# Patient Record
Sex: Female | Born: 1965 | Race: White | Hispanic: No | Marital: Married | Smoking: Never smoker
Health system: Southern US, Community
[De-identification: ages and names within clinical notes are randomized; demographics above are authoritative.]

## PROBLEM LIST (undated history)

## (undated) DIAGNOSIS — E785 Hyperlipidemia, unspecified: Secondary | ICD-10-CM

## (undated) DIAGNOSIS — M2550 Pain in unspecified joint: Secondary | ICD-10-CM

## (undated) DIAGNOSIS — C439 Malignant melanoma of skin, unspecified: Secondary | ICD-10-CM

## (undated) DIAGNOSIS — I10 Essential (primary) hypertension: Secondary | ICD-10-CM

## (undated) HISTORY — DX: Pain in unspecified joint: M25.50

## (undated) HISTORY — DX: Essential (primary) hypertension: I10

## (undated) HISTORY — PX: SKIN LESION EXCISION: SHX2412

## (undated) HISTORY — DX: Hyperlipidemia, unspecified: E78.5

## (undated) HISTORY — PX: OTHER SURGICAL HISTORY: SHX169

## (undated) HISTORY — DX: Malignant melanoma of skin, unspecified: C43.9

---

## 1995-07-15 DIAGNOSIS — C439 Malignant melanoma of skin, unspecified: Secondary | ICD-10-CM

## 1995-07-15 HISTORY — DX: Malignant melanoma of skin, unspecified: C43.9

## 1995-07-15 HISTORY — PX: SKIN CANCER EXCISION: SHX779

## 2005-09-12 ENCOUNTER — Ambulatory Visit: Payer: Self-pay

## 2005-12-01 ENCOUNTER — Ambulatory Visit: Payer: Self-pay | Admitting: Specialist

## 2006-03-18 ENCOUNTER — Ambulatory Visit: Payer: Self-pay | Admitting: General Surgery

## 2006-05-19 ENCOUNTER — Encounter: Payer: Self-pay | Admitting: Podiatry

## 2006-06-13 ENCOUNTER — Encounter: Payer: Self-pay | Admitting: Podiatry

## 2006-09-15 ENCOUNTER — Ambulatory Visit: Payer: Self-pay | Admitting: General Surgery

## 2007-09-16 ENCOUNTER — Ambulatory Visit: Payer: Self-pay | Admitting: Internal Medicine

## 2007-10-05 ENCOUNTER — Ambulatory Visit: Payer: Self-pay

## 2007-10-08 ENCOUNTER — Ambulatory Visit: Payer: Self-pay | Admitting: Internal Medicine

## 2008-10-05 ENCOUNTER — Ambulatory Visit: Payer: Self-pay

## 2009-04-24 ENCOUNTER — Ambulatory Visit: Payer: Self-pay | Admitting: Unknown Physician Specialty

## 2009-05-01 ENCOUNTER — Ambulatory Visit: Payer: Self-pay | Admitting: Unknown Physician Specialty

## 2009-10-11 ENCOUNTER — Ambulatory Visit: Payer: Self-pay | Admitting: Unknown Physician Specialty

## 2010-10-14 ENCOUNTER — Ambulatory Visit: Payer: Self-pay | Admitting: Unknown Physician Specialty

## 2011-07-15 HISTORY — PX: DILATION AND CURETTAGE OF UTERUS: SHX78

## 2011-10-15 ENCOUNTER — Ambulatory Visit: Payer: Self-pay | Admitting: Unknown Physician Specialty

## 2012-11-03 ENCOUNTER — Ambulatory Visit: Payer: Self-pay | Admitting: Obstetrics and Gynecology

## 2013-02-02 ENCOUNTER — Emergency Department: Payer: Self-pay | Admitting: Emergency Medicine

## 2013-02-07 ENCOUNTER — Ambulatory Visit: Payer: Self-pay | Admitting: Orthopedic Surgery

## 2013-02-07 LAB — HCG, QUANTITATIVE, PREGNANCY: Beta Hcg, Quant.: <1 m[IU]/mL — ABNORMAL LOW

## 2013-12-28 ENCOUNTER — Ambulatory Visit: Payer: Self-pay | Admitting: Obstetrics and Gynecology

## 2014-01-07 DIAGNOSIS — I10 Essential (primary) hypertension: Secondary | ICD-10-CM | POA: Insufficient documentation

## 2014-01-07 DIAGNOSIS — G43909 Migraine, unspecified, not intractable, without status migrainosus: Secondary | ICD-10-CM | POA: Insufficient documentation

## 2014-01-07 DIAGNOSIS — E785 Hyperlipidemia, unspecified: Secondary | ICD-10-CM | POA: Insufficient documentation

## 2014-05-03 ENCOUNTER — Ambulatory Visit (INDEPENDENT_AMBULATORY_CARE_PROVIDER_SITE_OTHER): Payer: BC Managed Care – PPO

## 2014-05-03 ENCOUNTER — Encounter: Payer: Self-pay | Admitting: Podiatry

## 2014-05-03 ENCOUNTER — Ambulatory Visit (INDEPENDENT_AMBULATORY_CARE_PROVIDER_SITE_OTHER): Payer: BC Managed Care – PPO | Admitting: Podiatry

## 2014-05-03 VITALS — BP 147/84 | HR 67 | Resp 16 | Ht 67.0 in | Wt 261.0 lb

## 2014-05-03 DIAGNOSIS — M722 Plantar fascial fibromatosis: Secondary | ICD-10-CM

## 2014-05-03 MED ORDER — METHYLPREDNISOLONE (PAK) 4 MG PO TABS: ORAL_TABLET | ORAL | Status: DC

## 2014-05-03 NOTE — Progress Notes (Signed)
   Subjective:    Patient ID: Haley Brennan, female    DOB: 1965/10/27, 48 y.o.   MRN: [id number]  HPI Comments: i have pain on the outside of my left heel. Its been hurting for a while maybe since august or septemeber. Its getting worse. It hurts to walk and stand. i have gel inserts in my shoes, used ice and elevation.  Foot Pain Associated symptoms include numbness.      Review of Systems  Constitutional: Positive for unexpected weight change.  HENT:       Ringing in ears  Musculoskeletal:       Difficulty walking  Neurological: Positive for numbness.  Hematological: Bruises/bleeds easily.  All other systems reviewed and are negative.      Objective:   Physical Exam: I have reviewed her past medical history medications allergies surgeries social history and review of systems. Pulses are strongly palpable bilateral. Neurologic sensorium is intact per since once the monofilament. Deep tendon reflexes are intact bilateral muscle strength is 5 over 5 dorsiflexors plantar flexors inverters everters all intrinsic musculature is intact. Orthopedic evaluation demonstrates all joints distal to the ankle a full range of motion without crepitus she has pain on palpation of the medial and lateral calcaneal tubercles of the left foot no pain on medial and lateral compression however. Radiographic evaluation demonstrates plantar distally oriented calcaneal heel spur as well as a posterior proximally oriented calcaneal heel spur. Soft tissue increase in density at the plantar fascial calcaneal insertion site is indicative of plantar fasciitis.        Assessment & Plan:  Assessment: Plantar fasciitis of the left foot. Lateral compensatory syndrome left foot.  Plan: Injected the left heel today from the lateral aspect of Kenalog and local anesthetic placed her in a plantar fascial brace and she will start utilizing a night splint that she has at home. We discussed appropriate stretching  exercises ice therapy shoe gear modifications. I will followup with her in one month. I also wrote a prescription for Sterapred Dosepak to be followed by meloxicam.

## 2014-06-05 ENCOUNTER — Encounter: Payer: Self-pay | Admitting: Podiatry

## 2014-06-05 ENCOUNTER — Ambulatory Visit (INDEPENDENT_AMBULATORY_CARE_PROVIDER_SITE_OTHER): Payer: BC Managed Care – PPO | Admitting: Podiatry

## 2014-06-05 VITALS — BP 156/88 | HR 68 | Resp 16

## 2014-06-05 DIAGNOSIS — M722 Plantar fascial fibromatosis: Secondary | ICD-10-CM

## 2014-06-05 NOTE — Progress Notes (Signed)
She presents today for follow-up of her plantar fasciitis left foot. She states that she is 100% improved. She states that it really made a difference when she started wearing her plantar fascial night splint.  Objective: Vital signs are stable she is alert and oriented 3. She has no pain on palpation medial calcaneal tubercle left heel.  Assessment: Resolving plantar fasciitis left.  Plan: Continue all conservative therapies for 1 more month then discontinued.

## 2014-07-12 ENCOUNTER — Encounter: Payer: Self-pay | Admitting: *Deleted

## 2014-08-01 ENCOUNTER — Ambulatory Visit (INDEPENDENT_AMBULATORY_CARE_PROVIDER_SITE_OTHER): Payer: BC Managed Care – PPO | Admitting: General Surgery

## 2014-08-01 ENCOUNTER — Encounter: Payer: Self-pay | Admitting: General Surgery

## 2014-08-01 ENCOUNTER — Other Ambulatory Visit: Payer: BC Managed Care – PPO

## 2014-08-01 VITALS — BP 144/90 | HR 76 | Resp 16 | Ht 67.0 in | Wt 265.0 lb

## 2014-08-01 DIAGNOSIS — R229 Localized swelling, mass and lump, unspecified: Secondary | ICD-10-CM

## 2014-08-01 DIAGNOSIS — Z85820 Personal history of malignant melanoma of skin: Secondary | ICD-10-CM

## 2014-08-01 DIAGNOSIS — M7989 Other specified soft tissue disorders: Secondary | ICD-10-CM

## 2014-08-01 DIAGNOSIS — M799 Soft tissue disorder, unspecified: Secondary | ICD-10-CM

## 2014-08-01 NOTE — Progress Notes (Signed)
Patient ID: Haley Brennan, female   DOB: 04-03-1966, 49 y.o.   MRN: [id number]  Chief Complaint  Patient presents with  . Mass    left elbow    HPI Haley Brennan is a 49 y.o. female.  Here today for evaluation of a mass on her left elbow. She states it has been there for about 2 years. She seems to think it has gotten larger over the past year. It did seem to be more tender with exam by Dr. Kellie Moor.   HPI  Past Medical History  Diagnosis Date  . Melanoma 1997  . Hyperlipemia   . Joint pain   . Hypertension     Past Surgical History  Procedure Laterality Date  . Skin cancer excision Left 1997    melonoma /upper thigh x2  . Dilation and curettage of uterus  2013    with ablation  . Bone spur Right     heel  . Skin lesion excision Left ?    elbow/pre cancerous per pt    Family History  Problem Relation Age of Onset  . Cancer Father   . Seizures Father     Social History History  Substance Use Topics  . Smoking status: Never Smoker   . Smokeless tobacco: Never Used  . Alcohol Use: 0.0 oz/week    0 Not specified per week     Comment: occasionally    No Known Allergies  Current Outpatient Prescriptions  Medication Sig Dispense Refill  . aspirin 81 MG chewable tablet Chew by mouth.    . Cholecalciferol (VITAMIN D3) 2000 UNITS capsule Take by mouth.    . citalopram (CELEXA) 20 MG tablet Take by mouth.    . hydrochlorothiazide (HYDRODIURIL) 25 MG tablet Take 12.5 mg by mouth.     . meloxicam (MOBIC) 7.5 MG tablet Take by mouth.    . metoprolol (TOPROL-XL) 200 MG 24 hr tablet Take 200 mg by mouth daily.     . Pyridoxine HCl (VITAMIN B-6 PO) Take by mouth daily.     No current facility-administered medications for this visit.    Review of Systems Review of Systems  Constitutional: Negative.   Respiratory: Negative.   Cardiovascular: Negative.     Blood pressure 144/90, pulse 76, resp. rate 16, height 5\' 7"  (1.702 m), weight 265 lb (120.203 kg),  last menstrual period 05/02/2014.  Physical Exam Physical Exam  Constitutional: She is oriented to person, place, and time. She appears well-developed and well-nourished.  Cardiovascular: Normal rate and normal heart sounds.   Pulmonary/Chest: Effort normal and breath sounds normal.  Lymphadenopathy:    She has no cervical adenopathy.    She has no axillary adenopathy.  Neurological: She is alert and oriented to person, place, and time.  Skin: Skin is warm and dry.  1 cm firm hard subcutaneous mass left posterior lateral elbow crease.    Data Reviewed   US of the mass performed. There is a 85mm irregular hypoechoic mass seen likely subfascial location. No shadowing or thru transmission noted.  Assessment    Left elbow soft tissue mass. Does not feel this is a lipoma. This is amenable to excision here in office.     Plan    Recommended excision of the mass. Pt is agreeable.        Merrie Epler G 08/01/2014, 4:51 PM

## 2014-08-01 NOTE — Patient Instructions (Signed)
The patient is aware to call back for any questions or concerns.  

## 2014-08-07 DIAGNOSIS — F325 Major depressive disorder, single episode, in full remission: Secondary | ICD-10-CM | POA: Insufficient documentation

## 2014-08-15 ENCOUNTER — Ambulatory Visit (INDEPENDENT_AMBULATORY_CARE_PROVIDER_SITE_OTHER): Payer: BC Managed Care – PPO | Admitting: General Surgery

## 2014-08-15 ENCOUNTER — Encounter: Payer: Self-pay | Admitting: General Surgery

## 2014-08-15 VITALS — BP 130/84 | HR 78 | Resp 14 | Ht 67.0 in | Wt 263.0 lb

## 2014-08-15 DIAGNOSIS — M799 Soft tissue disorder, unspecified: Secondary | ICD-10-CM

## 2014-08-15 DIAGNOSIS — M7989 Other specified soft tissue disorders: Secondary | ICD-10-CM

## 2014-08-15 DIAGNOSIS — R229 Localized swelling, mass and lump, unspecified: Secondary | ICD-10-CM

## 2014-08-15 NOTE — Patient Instructions (Signed)
The patient is aware to call back for any questions or concerns.  

## 2014-08-15 NOTE — Progress Notes (Signed)
Patient ID: MERIA CRILLY, female   DOB: 1965/11/11, 49 y.o.   MRN: [id number]   The patient presents for a procedure. The procedure to be performed in an excision of a mass on the left elbow. No new complaints at this time.   The mass in question in the lateral aspect the left elbow was identified and marked on the skin with a linear incision planned. The area was infiltrated with about 8 mL 1% Xylocaine mixed with 0.5% Marcaine. After prepping with ChloraPrep and draping skin incision was made and deepened carefully through the subcutaneous tissue, few bleeding points were cauterized with thermal cautery. The deep fascia was opened and a firm 6-7 mm nodule was identified somewhat grayish in color. This was adherent to a little bit of the fatty tissue in this region and was  carefully freed and removed.   Specimen was sent to  histology in formalin. Deeper tissue closed with 3-0 Vicryl and the skin with subcuticular 4-0 Vicryl. Steri-Strips with tincture benzoin were applied and dressed with Telfa and Tegaderm. Procedure was well-tolerated with no immediate problems encountered. Patient will be advised to pathology once available follow-up will be announced based on the pathology.

## 2015-02-05 DIAGNOSIS — Z6841 Body Mass Index (BMI) 40.0 and over, adult: Secondary | ICD-10-CM | POA: Insufficient documentation

## 2016-02-06 ENCOUNTER — Other Ambulatory Visit: Payer: Self-pay | Admitting: Internal Medicine

## 2016-02-06 DIAGNOSIS — Z1231 Encounter for screening mammogram for malignant neoplasm of breast: Secondary | ICD-10-CM

## 2016-02-06 DIAGNOSIS — Z0000 Encounter for general adult medical examination without abnormal findings: Secondary | ICD-10-CM | POA: Insufficient documentation

## 2016-02-12 ENCOUNTER — Ambulatory Visit
Admission: RE | Admit: 2016-02-12 | Discharge: 2016-02-12 | Disposition: A | Discharge status: Home / Self Care | Payer: BC Managed Care – PPO | Source: Ambulatory Visit | Attending: Internal Medicine | Admitting: Internal Medicine

## 2016-02-12 ENCOUNTER — Other Ambulatory Visit: Payer: Self-pay | Admitting: Internal Medicine

## 2016-02-12 DIAGNOSIS — Z1231 Encounter for screening mammogram for malignant neoplasm of breast: Secondary | ICD-10-CM | POA: Insufficient documentation

## 2016-07-22 ENCOUNTER — Other Ambulatory Visit: Payer: Self-pay | Admitting: Internal Medicine

## 2016-07-22 DIAGNOSIS — N63 Unspecified lump in unspecified breast: Secondary | ICD-10-CM

## 2016-08-01 ENCOUNTER — Encounter: Payer: Self-pay | Admitting: Radiology

## 2016-08-01 ENCOUNTER — Ambulatory Visit
Admission: RE | Admit: 2016-08-01 | Discharge: 2016-08-01 | Disposition: A | Discharge status: Home / Self Care | Payer: BC Managed Care – PPO | Source: Ambulatory Visit | Attending: Internal Medicine | Admitting: Internal Medicine

## 2016-08-01 DIAGNOSIS — N63 Unspecified lump in unspecified breast: Secondary | ICD-10-CM

## 2018-12-08 ENCOUNTER — Other Ambulatory Visit: Payer: Self-pay | Admitting: Podiatry

## 2018-12-08 ENCOUNTER — Ambulatory Visit (INDEPENDENT_AMBULATORY_CARE_PROVIDER_SITE_OTHER): Payer: BC Managed Care – PPO

## 2018-12-08 ENCOUNTER — Encounter: Payer: Self-pay | Admitting: Podiatry

## 2018-12-08 ENCOUNTER — Ambulatory Visit: Payer: BC Managed Care – PPO | Admitting: Podiatry

## 2018-12-08 ENCOUNTER — Other Ambulatory Visit: Payer: Self-pay

## 2018-12-08 VITALS — Temp 97.5°F

## 2018-12-08 DIAGNOSIS — J45909 Unspecified asthma, uncomplicated: Secondary | ICD-10-CM | POA: Insufficient documentation

## 2018-12-08 DIAGNOSIS — M7662 Achilles tendinitis, left leg: Secondary | ICD-10-CM | POA: Diagnosis not present

## 2018-12-08 DIAGNOSIS — Z85820 Personal history of malignant melanoma of skin: Secondary | ICD-10-CM | POA: Insufficient documentation

## 2018-12-08 DIAGNOSIS — M722 Plantar fascial fibromatosis: Secondary | ICD-10-CM

## 2018-12-08 MED ORDER — METHYLPREDNISOLONE 4 MG PO TBPK: ORAL_TABLET | ORAL | 0 refills | Status: DC

## 2018-12-08 NOTE — Patient Instructions (Signed)

## 2018-12-08 NOTE — Progress Notes (Signed)
Subjective:  Patient ID: Haley Brennan, female    DOB: 1966/07/12,  MRN: [id number] HPI Chief Complaint  Patient presents with  . Foot Pain    Posterior heel left - aching x 1 week, AM pain, worsened last couple days, some swelling, icing and not going barefoot  . Foot Pain    Check knot dorsal midfoot left     . New Patient (Initial Visit)    Est pt 4    53 y.o. female presents with the above complaint.   ROS: Denies fever chills nausea vomiting muscle aches pains calf pain back pain chest pain shortness of breath.  Past Medical History:  Diagnosis Date  . Hyperlipemia   . Hypertension   . Joint pain   . Melanoma ([place]) 1997   Past Surgical History:  Procedure Laterality Date  . bone spur Right    heel  . DILATION AND CURETTAGE OF UTERUS  2013   with ablation  . SKIN CANCER EXCISION Left 1997   melonoma /upper thigh x2  . SKIN LESION EXCISION Left ?   elbow/pre cancerous per pt    Current Outpatient Medications:  .  metoprolol (TOPROL-XL) 200 MG 24 hr tablet, Take 200 mg by mouth daily. , Disp: , Rfl:  .  aspirin 81 MG chewable tablet, Chew by mouth., Disp: , Rfl:  .  Cholecalciferol (VITAMIN D3) 2000 UNITS capsule, Take by mouth., Disp: , Rfl:  .  citalopram (CELEXA) 20 MG tablet, Take by mouth., Disp: , Rfl:  .  methylPREDNISolone (MEDROL DOSEPAK) 4 MG TBPK tablet, 6 day dose pack - take as directed, Disp: 21 tablet, Rfl: 0 .  olmesartan-hydrochlorothiazide (BENICAR HCT) 40-25 MG tablet, , Disp: , Rfl:  .  Pyridoxine HCl (VITAMIN B-6 PO), Take by mouth daily., Disp: , Rfl:  .  traZODone (DESYREL) 50 MG tablet, , Disp: , Rfl:   No Known Allergies Review of Systems Objective:   Vitals:   12/08/18 0908  Temp: (!) 97.5 F (36.4 C)    General: Well developed, nourished, in no acute distress, alert and oriented x3   Dermatological: Skin is warm, dry and supple bilateral. Nails x 10 are well maintained; remaining integument appears unremarkable at this  time. There are no open sores, no preulcerative lesions, no rash or signs of infection present.  Vascular: Dorsalis Pedis artery and Posterior Tibial artery pedal pulses are 2/4 bilateral with immedate capillary fill time. Pedal hair growth present. No varicosities and no lower extremity edema present bilateral.   Neruologic: Grossly intact via light touch bilateral. Vibratory intact via tuning fork bilateral. Protective threshold with Semmes Wienstein monofilament intact to all pedal sites bilateral. Patellar and Achilles deep tendon reflexes 2+ bilateral. No Babinski or clonus noted bilateral.   Musculoskeletal: No gross boney pedal deformities bilateral. No pain, crepitus, or limitation noted with foot and ankle range of motion bilateral. Muscular strength 5/5 in all groups tested bilateral.  Gait: Unassisted, Nonantalgic.    Radiographs:  Radiographs taken today demonstrate retrocalcaneal heel spur as well as plantar calcaneal heel spur with calcification of the plantar fascia.  She also has thickening of soft tissue in the posterior aspect of the calcaneus possibly bursitis also thickening of the distal Achilles indicating a tendinopathy.  Assessment & Plan:   Assessment: Insertional Achilles tendinitis with tendinopathy.  Plan: Start her on a Medrol Dosepak.  Injected the posterior aspect with 2 mg of dexamethasone and local anesthetic extra Betadine skin prep into the bursa.  She  tolerated procedure well without complications.  Placed her in a short cam walker.  We will follow-up with her in 1 month.     Jessilynn Taft T. [place], [place]

## 2019-01-12 ENCOUNTER — Ambulatory Visit: Payer: BC Managed Care – PPO | Admitting: Podiatry

## 2019-01-12 ENCOUNTER — Encounter: Payer: Self-pay | Admitting: Podiatry

## 2019-01-12 ENCOUNTER — Other Ambulatory Visit: Payer: Self-pay

## 2019-01-12 VITALS — Temp 98.6°F

## 2019-01-12 DIAGNOSIS — M7662 Achilles tendinitis, left leg: Secondary | ICD-10-CM | POA: Diagnosis not present

## 2019-01-12 DIAGNOSIS — S86012A Strain of left Achilles tendon, initial encounter: Secondary | ICD-10-CM

## 2019-01-12 DIAGNOSIS — S86312A Strain of muscle(s) and tendon(s) of peroneal muscle group at lower leg level, left leg, initial encounter: Secondary | ICD-10-CM

## 2019-01-12 NOTE — Progress Notes (Signed)
She presents today left foot and ankle has some swelling to it and some days are good and some days are bad she states that the medicine appeared to do great work for her.  Objective: Vital signs are stable she is alert and oriented x3 she has severe pain on palpation of Achilles tendon left.  Swelling around the ankle.  Assessment: At this point I feel that there is probably a tear of the tendo Achilles in the posterior inferior aspect.  Soft tissue edema along the peroneals question as to whether or not there may be a tear in the peroneal tendons as well.  Plan: At this point requesting an MRI so as to determine whether or not we can perform surgery or physical therapy.

## 2019-01-12 NOTE — Addendum Note (Signed)
Addended by: Rip Harbour on: 01/12/2019 05:46 PM   Modules accepted: Orders

## 2019-01-16 ENCOUNTER — Encounter: Payer: Self-pay | Admitting: Podiatry

## 2019-01-20 NOTE — Telephone Encounter (Signed)
I called and spoke with patient, she states that the swelling in her leg has gone down a lot and she denied any pain or cramping in her calf.  She states that the compression anklet does help with pain and swelling in her ankle/foot.  I educated her on s/s of DVT and to go straight to ER is she has those symptoms.  I instructed her to continue to keep foot/leg elevated as needed for pain and swelling, use ice and night splint to help with stretching.  She will keep MRI appt and follow up with Dr. Milinda Pointer after MRI is done.

## 2019-01-25 ENCOUNTER — Telehealth: Payer: Self-pay | Admitting: *Deleted

## 2019-01-25 NOTE — Telephone Encounter (Signed)
MRI has been approved from 01/25/2019 to 07/23/2019 Auth # [id number]

## 2019-01-25 NOTE — Telephone Encounter (Signed)
Haley Brennan - [facility] pre-cert is needed for MRI.

## 2019-01-27 ENCOUNTER — Other Ambulatory Visit: Payer: Self-pay

## 2019-01-27 ENCOUNTER — Ambulatory Visit
Admission: RE | Admit: 2019-01-27 | Discharge: 2019-01-27 | Disposition: A | Discharge status: Home / Self Care | Payer: BC Managed Care – PPO | Source: Ambulatory Visit | Attending: Podiatry | Admitting: Podiatry

## 2019-01-27 DIAGNOSIS — S86012A Strain of left Achilles tendon, initial encounter: Secondary | ICD-10-CM | POA: Insufficient documentation

## 2019-01-27 DIAGNOSIS — S86312A Strain of muscle(s) and tendon(s) of peroneal muscle group at lower leg level, left leg, initial encounter: Secondary | ICD-10-CM | POA: Diagnosis present

## 2019-02-01 ENCOUNTER — Telehealth: Payer: Self-pay

## 2019-02-01 NOTE — Telephone Encounter (Signed)
-----   Message from Andres Ege, RN sent at 01/28/2019  9:44 AM EDT ----- Janace Hoard, please assist. Marcy Siren ----- Message ----- From: Cedric Fishman Sent: 01/27/2019   5:05 PM EDT To: Andres Ege, RN  Send for over read and inform patient of the delay.

## 2019-02-01 NOTE — Telephone Encounter (Signed)
Request for MRI disc has been faxed to [facility].  Patient has been notified via voice mail.

## 2019-02-09 ENCOUNTER — Telehealth: Payer: Self-pay

## 2019-02-09 NOTE — Telephone Encounter (Signed)
Copy of disc mailed to [facility].

## 2019-02-09 NOTE — Telephone Encounter (Signed)
-----   Message from Andres Ege, RN sent at 01/28/2019  9:44 AM EDT ----- Janace Hoard, please assist. Marcy Siren ----- Message ----- From: Cedric Fishman Sent: 01/27/2019   5:05 PM EDT To: Andres Ege, RN  Send for over read and inform patient of the delay.

## 2019-02-22 ENCOUNTER — Encounter: Payer: Self-pay | Admitting: Podiatry

## 2019-02-23 ENCOUNTER — Encounter: Payer: Self-pay | Admitting: Podiatry

## 2019-02-23 ENCOUNTER — Ambulatory Visit: Payer: BC Managed Care – PPO | Admitting: Podiatry

## 2019-02-23 ENCOUNTER — Other Ambulatory Visit: Payer: Self-pay

## 2019-02-23 ENCOUNTER — Telehealth: Payer: Self-pay | Admitting: *Deleted

## 2019-02-23 VITALS — Temp 97.9°F

## 2019-02-23 DIAGNOSIS — M7662 Achilles tendinitis, left leg: Secondary | ICD-10-CM | POA: Diagnosis not present

## 2019-02-23 DIAGNOSIS — S86012A Strain of left Achilles tendon, initial encounter: Secondary | ICD-10-CM

## 2019-02-23 NOTE — Telephone Encounter (Signed)
"  I'm calling to schedule an appointment."

## 2019-02-23 NOTE — Progress Notes (Signed)
She presents today for chronic pain to the Achilles tendon left.  States that it is okay when I am in the boot but 5 not in the boot it hurts.  She states that is can wear this boot forever.  She denies changes in her past medical history medications allergies surgeries and social history.  ROS: Denies fever chills nausea vomiting muscle aches pains calf pain back pain chest pain shortness of breath.  Objective: Vital signs are stable alert and oriented x3.  Pulses are palpable.  Left foot is swollen painful on palpation of the Achilles particularly overlying the insertion of the posterior calcaneus she has good range of motion plantar flexion is painful  MRI does relate distal tendinopathy with Achilles tendon tears at its insertion.  Assessment: Insertional Achilles tendinitis and tendinopathy with tearing.  Plan: Discussed etiology pathology conservative versus surgical therapies.  At this point we consented her for a gastroc recession and Achilles tenolysis and a retrocalcaneal heel spur resection.  She will also have a cast on.  We did discuss the possible postop complications which may include but not limited to postop pain bleeding swelling infection recurrence need for further surgery overcorrection under correction also digit loss of limb loss of life.  She was provided with both oral and written ingrown home-going instruction for the morning of surgery as well as information regarding the surgery center and anesthesia group.

## 2019-02-23 NOTE — Patient Instructions (Signed)
Pre-Operative Instructions  Congratulations, you have decided to take an important step towards improving your quality of life.  You can be assured that the doctors and staff at [facility] will be with you every step of the way.  Here are some important things you should know:  1. Plan to be at the surgery center/hospital at least 1 (one) hour prior to your scheduled time, unless otherwise directed by the surgical center/hospital staff.  You must have a responsible adult accompany you, remain during the surgery and drive you home.  Make sure you have directions to the surgical center/hospital to ensure you arrive on time. 2. If you are having surgery at [facility] or [facility], you will need a copy of your medical history and physical form from your family physician within one month prior to the date of surgery. We will give you a form for your primary physician to complete.  3. We make every effort to accommodate the date you request for surgery.  However, there are times where surgery dates or times have to be moved.  We will contact you as soon as possible if a change in schedule is required.   4. No aspirin/ibuprofen for one week before surgery.  If you are on aspirin, any non-steroidal anti-inflammatory medications (Mobic, Aleve, Ibuprofen) should not be taken seven (7) days prior to your surgery.  You make take Tylenol for pain prior to surgery.  5. Medications - If you are taking daily heart and blood pressure medications, seizure, reflux, allergy, asthma, anxiety, pain or diabetes medications, make sure you notify the surgery center/hospital before the day of surgery so they can tell you which medications you should take or avoid the day of surgery. 6. No food or drink after midnight the night before surgery unless directed otherwise by surgical center/hospital staff. 7. No alcoholic beverages 24-hours prior to surgery.  No smoking 24-hours prior or 24-hours after  surgery. 8. Wear loose pants or shorts. They should be loose enough to fit over bandages, boots, and casts. 9. Don't wear slip-on shoes. Sneakers are preferred. 10. Bring your boot with you to the surgery center/hospital.  Also bring crutches or a walker if your physician has prescribed it for you.  If you do not have this equipment, it will be provided for you after surgery. 11. If you have not been contacted by the surgery center/hospital by the day before your surgery, call to confirm the date and time of your surgery. 12. Leave-time from work may vary depending on the type of surgery you have.  Appropriate arrangements should be made prior to surgery with your employer. 13. Prescriptions will be provided immediately following surgery by your doctor.  Fill these as soon as possible after surgery and take the medication as directed. Pain medications will not be refilled on weekends and must be approved by the doctor. 14. Remove nail polish on the operative foot and avoid getting pedicures prior to surgery. 15. Wash the night before surgery.  The night before surgery wash the foot and leg well with water and the antibacterial soap provided. Be sure to pay special attention to beneath the toenails and in between the toes.  Wash for at least three (3) minutes. Rinse thoroughly with water and dry well with a towel.  Perform this wash unless told not to do so by your physician.  Enclosed: 1 Ice pack (please put in freezer the night before surgery)   1 Hibiclens skin cleaner     Pre-op instructions  If you have any questions regarding the instructions, please do not hesitate to call our office.  [place]: [address], [place], [place] [address] -- [phone]  [place]: [address] [place], [place] [address] -- [phone]  [place]: [address]  , [place] [address] -- [phone]  [place]: [address], [address], [place], [place] [address] -- [phone]  Website:  [email or web address] 

## 2019-02-24 NOTE — Telephone Encounter (Signed)
"  I am calling to try to schedule an appointment for surgery.  I went and saw Dr. Milinda Pointer yesterday about surgery on my ankle."

## 2019-02-25 NOTE — Telephone Encounter (Signed)
"  I'm calling to schedule my surgery."  Dr. Milinda Pointer does surgeries on Fridays.  Do you have a date that you like?  "Whatever his next available date is but not too soon, maybe within a month or so."  Dr. Milinda Pointer can do it on any Friday in September.  "I can't do it on September 4, how about the following Friday?"  Dr. Milinda Pointer can do it on March 25, 2019.  "Okay, let's schedule it for then."  Someone from the surgical center will call you a day or two prior to your surgery date and will give you your arrival time.  You need to register with the surgical center via their online portal, instructions are located in the brochure that we gave you on the dark blue page.  It's called [facility].  "I will take care of it."

## 2019-03-11 ENCOUNTER — Telehealth: Payer: Self-pay | Admitting: *Deleted

## 2019-03-11 NOTE — Telephone Encounter (Signed)
DOS 03/25/2019 MANIPULATION OF ANKLE - 27860; CALCANEAL OSTECTOMY - [id number]; TARSAL EXOSTECTOMY - [id number]; ACHILLES TENOLYSIS - [id number]; AND GASTROCNEMIUS RECESSION - [id number]  BCBS: Effective Date - 07/14/2018 - 07/13/9998  In-Network    Max Per Benefit Period Year-to-Date Remaining  CoInsurance  30%   Deductible  $1500.00 $0.00  Out-Of-Pocket  $5900.00 $3376.40   AMBULATORY SURGERY  In Network  Copay Coinsurance  Not Applicable  [id number] per [place]

## 2019-03-16 ENCOUNTER — Encounter: Payer: Self-pay | Admitting: Podiatry

## 2019-03-22 ENCOUNTER — Telehealth: Payer: Self-pay | Admitting: Podiatry

## 2019-03-22 NOTE — Telephone Encounter (Signed)
Pt called, she has sent a mychart message on 03/17/2019. Please call pt, she has sx on Friday with Dr. Milinda Pointer and she has questions.

## 2019-03-24 ENCOUNTER — Other Ambulatory Visit: Payer: Self-pay | Admitting: Podiatry

## 2019-03-24 MED ORDER — OXYCODONE-ACETAMINOPHEN 10-325 MG PO TABS: 1.0000 | ORAL_TABLET | Freq: Three times a day (TID) | ORAL | 0 refills | Status: AC | PRN

## 2019-03-24 MED ORDER — CEPHALEXIN 500 MG PO CAPS: 500.0000 mg | ORAL_CAPSULE | Freq: Three times a day (TID) | ORAL | 0 refills | Status: DC

## 2019-03-24 MED ORDER — ONDANSETRON HCL 4 MG PO TABS: 4.0000 mg | ORAL_TABLET | Freq: Three times a day (TID) | ORAL | 0 refills | Status: DC | PRN

## 2019-03-24 NOTE — Telephone Encounter (Signed)
I called patient and left message to call back.

## 2019-03-24 NOTE — Telephone Encounter (Signed)
I called patient and all questions were answered regarding surgery and post op.  I informed her to call me back if she had any further questions.  She verbalized understanding

## 2019-03-25 ENCOUNTER — Encounter: Payer: Self-pay | Admitting: Podiatry

## 2019-03-25 DIAGNOSIS — M25772 Osteophyte, left ankle: Secondary | ICD-10-CM

## 2019-03-25 DIAGNOSIS — M25775 Osteophyte, left foot: Secondary | ICD-10-CM | POA: Diagnosis not present

## 2019-03-25 DIAGNOSIS — M7662 Achilles tendinitis, left leg: Secondary | ICD-10-CM | POA: Diagnosis not present

## 2019-03-25 DIAGNOSIS — M216X2 Other acquired deformities of left foot: Secondary | ICD-10-CM

## 2019-03-25 DIAGNOSIS — M7732 Calcaneal spur, left foot: Secondary | ICD-10-CM

## 2019-03-30 ENCOUNTER — Other Ambulatory Visit: Payer: Self-pay

## 2019-03-30 ENCOUNTER — Ambulatory Visit (INDEPENDENT_AMBULATORY_CARE_PROVIDER_SITE_OTHER): Payer: Self-pay | Admitting: Podiatry

## 2019-03-30 ENCOUNTER — Ambulatory Visit (INDEPENDENT_AMBULATORY_CARE_PROVIDER_SITE_OTHER): Payer: BC Managed Care – PPO

## 2019-03-30 ENCOUNTER — Encounter: Payer: Self-pay | Admitting: Podiatry

## 2019-03-30 VITALS — BP 107/67 | HR 63 | Temp 98.2°F | Resp 16

## 2019-03-30 DIAGNOSIS — S86012A Strain of left Achilles tendon, initial encounter: Secondary | ICD-10-CM

## 2019-03-30 DIAGNOSIS — M7662 Achilles tendinitis, left leg: Secondary | ICD-10-CM | POA: Diagnosis not present

## 2019-03-30 DIAGNOSIS — Z9889 Other specified postprocedural states: Secondary | ICD-10-CM

## 2019-03-30 NOTE — Progress Notes (Signed)
She presents today status post Achilles tenolysis gastroc recession calcaneal osteotomy and tarsal access ectomy states it was swollen last night and painful.  She denies fever chills nausea vomiting muscle aches pains calf pain back pain chest pain shortness of breath.  Objective: She presents today vital signs stable alert and oriented x3 cast intact clean on the bottom utilizing the scooter.  Toes are normal temperature and have good range of motion with good sensation.  Radiographs taken today demonstrate a retrocalcaneal heel spur resection complete.  Assessment: Well-healing surgical foot.  Plan: Follow-up with her in 1 week for cast change.

## 2019-04-06 ENCOUNTER — Other Ambulatory Visit: Payer: Self-pay

## 2019-04-06 ENCOUNTER — Encounter: Payer: Self-pay | Admitting: Podiatry

## 2019-04-06 ENCOUNTER — Ambulatory Visit (INDEPENDENT_AMBULATORY_CARE_PROVIDER_SITE_OTHER): Payer: BC Managed Care – PPO | Admitting: Podiatry

## 2019-04-06 DIAGNOSIS — Z9889 Other specified postprocedural states: Secondary | ICD-10-CM | POA: Diagnosis not present

## 2019-04-07 ENCOUNTER — Encounter: Payer: Self-pay | Admitting: Podiatry

## 2019-04-07 NOTE — Progress Notes (Signed)
She presents today for postop visit #2 date of surgery 03/25/2019 status post Achilles tenolysis gastroc recession calcaneal  ostectomy tarsal exostectomy and manipulation of the ankle with a cast.  She denies fever chills nausea vomiting muscle aches pains calf pain back pain chest pain shortness of breath.  Objective: Presents today nonweightbearing cast intact clean and dry.  Once removed demonstrates dressed her dressing intact once removed demonstrates staples are intact margins well coapted.  No erythema edema cellulitis drainage or odor.  Skin good plantar flexion against resistance with no pain.  Assessment: Well-healing surgical foot.  Plan: Redressed today dressed a compressive dressing.  She also had a new cast applied she will continue nonweightbearing status follow-up with her in 2 weeks for cast change to a cam walker.

## 2019-04-20 ENCOUNTER — Encounter: Payer: Self-pay | Admitting: Podiatry

## 2019-04-20 ENCOUNTER — Ambulatory Visit (INDEPENDENT_AMBULATORY_CARE_PROVIDER_SITE_OTHER): Payer: BC Managed Care – PPO | Admitting: Podiatry

## 2019-04-20 ENCOUNTER — Other Ambulatory Visit: Payer: Self-pay

## 2019-04-20 DIAGNOSIS — M7662 Achilles tendinitis, left leg: Secondary | ICD-10-CM

## 2019-04-20 DIAGNOSIS — S86012A Strain of left Achilles tendon, initial encounter: Secondary | ICD-10-CM

## 2019-04-20 DIAGNOSIS — Z9889 Other specified postprocedural states: Secondary | ICD-10-CM

## 2019-04-20 NOTE — Progress Notes (Signed)
She presents today date of surgery March 25, 2019 we will follow-up with she is status post Achilles tenolysis gastrocnemius recession calcaneal osteotomy tarsal exostectomy manipulation of ankle and cast.  She denies fever chills nausea vomiting muscle aches pains.  States that she has some allodynic type pain since the cast was removed today.  Objective: Vital signs are stable she is alert and oriented x3.  Pulses are palpable once the cast was removed.  Dressed her dressing was removed demonstrates staples are intact margins well coapted staples were removed today does not demonstrate any type of dehiscence.  She has good plantar flexion against resistance.  Assessment: Well-healing surgical leg and foot.  Plan: Request that she use Steri-Strips for the next few days placed a small light dressing and then in 3 days she can start washing this.  Placed her in a cam walker she will start with static standing over the next week regress to partial weightbearing with a knee scooter and then over the third week progressed to full weightbearing with the boot.  Follow-up with her in 2 to 3 weeks

## 2019-04-28 ENCOUNTER — Encounter: Payer: Self-pay | Admitting: Podiatry

## 2019-04-28 ENCOUNTER — Other Ambulatory Visit: Payer: Self-pay

## 2019-04-28 ENCOUNTER — Ambulatory Visit (INDEPENDENT_AMBULATORY_CARE_PROVIDER_SITE_OTHER): Payer: Self-pay | Admitting: Podiatry

## 2019-04-28 ENCOUNTER — Encounter: Payer: BC Managed Care – PPO | Admitting: Podiatry

## 2019-04-28 DIAGNOSIS — Z09 Encounter for follow-up examination after completed treatment for conditions other than malignant neoplasm: Secondary | ICD-10-CM | POA: Insufficient documentation

## 2019-04-28 NOTE — Progress Notes (Signed)
Patient says she  has a staple at the back of her surgical site from her surgery by Dr. Milinda Pointer.  I  walk into the treatment room and she shows me a picture of her surgical site with what appears to be a staple covering an open area of the incision site.  She says she has occasional pain at the site of the staple and skin separation.  No evidence of any pus or drainage or infection is described.  She presents the office today on a scooter wearing a bandage over the back of her left foot covering the surgical site.  Neurovascular status intact left foot.  Healing noted at the surgical sites on the left foot.  There is separation of skin at the distal aspect of the incision at the Achilles tendon left foot.  There is no evidence of any staple clinically.  Patient does have necrotic tissue which appears to be suture related at the open wound site left foot.    ROV post op visit.    Examination of the  open wound reveals no evidence of any staple noted.  The necrotic tissue at the site of the open wound is debrided after Betadine wash.  The site was explored with surgical hemostat and no evidence of any screw/staple was noted.  The open wound was bandaged with Neosporin dry sterile dressing.  Patient was told that she should soak this foot in soapy sudsy water and cover the site with Neosporin dry sterile dressing.  She should return to the office for her regularly scheduled visit with Dr. Milinda Pointer.   Gardiner Barefoot DPM

## 2019-05-04 ENCOUNTER — Other Ambulatory Visit: Payer: BC Managed Care – PPO

## 2019-05-09 ENCOUNTER — Encounter: Payer: Self-pay | Admitting: Podiatry

## 2019-05-09 ENCOUNTER — Other Ambulatory Visit: Payer: Self-pay

## 2019-05-09 ENCOUNTER — Other Ambulatory Visit: Payer: BC Managed Care – PPO

## 2019-05-09 ENCOUNTER — Ambulatory Visit (INDEPENDENT_AMBULATORY_CARE_PROVIDER_SITE_OTHER): Payer: BC Managed Care – PPO

## 2019-05-09 ENCOUNTER — Ambulatory Visit (INDEPENDENT_AMBULATORY_CARE_PROVIDER_SITE_OTHER): Payer: Self-pay | Admitting: Podiatry

## 2019-05-09 DIAGNOSIS — Z9889 Other specified postprocedural states: Secondary | ICD-10-CM

## 2019-05-09 DIAGNOSIS — S86012A Strain of left Achilles tendon, initial encounter: Secondary | ICD-10-CM

## 2019-05-09 NOTE — Progress Notes (Signed)
She presents today date of surgery 03/25/2019 status post Achilles tendon repair with a gastroc recession.  States that it seems to be doing a lot better and the wound that was present distally appears to be healing very nicely.  She is concerned that she saw Dr. Sharyon Cable last week for removal of staple.  She states that he could not find the staple but it seems to be getting better anyway.  Currently she is no longer wearing her cam boot.  Objective: Vital signs are stable she is alert and oriented x3.  There is no erythema minimal edema no cellulitis drainage or odor lesion to the posterior aspect where the incision was appears to be healing very nicely from deep to superficial and I see no signs of a staple.  Radiographs were performed today to make sure that there was no retention of a staple in the soft tissues.  Assessment: Well-healing surgical foot left.  Plan: Discussed etiology pathology conservative surgical therapies encouraged her to have continue range of motion and to continue to treat the wound until is completely healed.  Also continue to exercise caution when walking without the boot but I would encourage her to continue to walk without the boot to increase strength and stability.  I will follow-up with her in 1 month we will allow her to get back to work as long as she can sit and keep foot elevated.

## 2019-06-03 ENCOUNTER — Encounter: Payer: Self-pay | Admitting: Podiatry

## 2019-06-15 ENCOUNTER — Ambulatory Visit (INDEPENDENT_AMBULATORY_CARE_PROVIDER_SITE_OTHER): Payer: Self-pay | Admitting: Podiatry

## 2019-06-15 ENCOUNTER — Other Ambulatory Visit: Payer: Self-pay

## 2019-06-15 DIAGNOSIS — Z9889 Other specified postprocedural states: Secondary | ICD-10-CM

## 2019-06-15 NOTE — Progress Notes (Signed)
She presents today for left Achilles tendon states that the dorsal midfoot is still little bit numb but other than that the Achilles seems to be doing okay.  Still likes to wear her boot.  Objective: She has no reproducible pain on palpation of the left foot and heel or gastroc.  Assessment: Well-healing surgical foot.  Plan: On follow-up with her in a couple of months.

## 2019-07-01 ENCOUNTER — Encounter: Payer: Self-pay | Admitting: Podiatry

## 2019-07-01 DIAGNOSIS — I82402 Acute embolism and thrombosis of unspecified deep veins of left lower extremity: Secondary | ICD-10-CM

## 2019-07-01 DIAGNOSIS — M25472 Effusion, left ankle: Secondary | ICD-10-CM

## 2019-07-01 DIAGNOSIS — I872 Venous insufficiency (chronic) (peripheral): Secondary | ICD-10-CM

## 2019-07-01 DIAGNOSIS — Z9889 Other specified postprocedural states: Secondary | ICD-10-CM

## 2019-07-01 NOTE — Telephone Encounter (Signed)
Dr. Milinda Pointer, I spoke with patient, she said that she is having trouble with her surgical ankle swelling.  She stated that she keeps it elevated some and doesn't really hurt.  I recommended her getting some knee high compression stockings to try and elevate when the swelling got bad.  Do you have any further recommendations or do you want her to come back in sooner than her follow up appt?  She knows It will be Monday before I get back to her.  Please advise

## 2019-07-05 ENCOUNTER — Other Ambulatory Visit: Payer: Self-pay

## 2019-07-05 ENCOUNTER — Encounter: Payer: Self-pay | Admitting: Podiatry

## 2019-07-05 ENCOUNTER — Ambulatory Visit (INDEPENDENT_AMBULATORY_CARE_PROVIDER_SITE_OTHER): Payer: BC Managed Care – PPO

## 2019-07-05 DIAGNOSIS — Z9889 Other specified postprocedural states: Secondary | ICD-10-CM | POA: Diagnosis not present

## 2019-07-05 DIAGNOSIS — I82402 Acute embolism and thrombosis of unspecified deep veins of left lower extremity: Secondary | ICD-10-CM

## 2019-07-05 DIAGNOSIS — M25472 Effusion, left ankle: Secondary | ICD-10-CM | POA: Diagnosis not present

## 2019-07-05 NOTE — Addendum Note (Signed)
Addended by: Graceann Congress D on: 07/05/2019 09:42 AM   Modules accepted: Orders

## 2019-07-05 NOTE — Telephone Encounter (Signed)
Per Dr. Milinda Pointer, orders have been entered in epic and faxed to [facility] for vascular studies stat.  Patient has been notified of his instructions and will wait for call from [facility] to be scheduled.

## 2019-07-06 ENCOUNTER — Ambulatory Visit: Payer: BC Managed Care – PPO | Attending: Internal Medicine

## 2019-07-06 ENCOUNTER — Encounter: Payer: Self-pay | Admitting: *Deleted

## 2019-07-06 ENCOUNTER — Other Ambulatory Visit: Payer: Self-pay | Admitting: *Deleted

## 2019-07-06 DIAGNOSIS — Z20822 Contact with and (suspected) exposure to covid-19: Secondary | ICD-10-CM

## 2019-07-08 LAB — NOVEL CORONAVIRUS, NAA: SARS-CoV-2, NAA: NOT DETECTED

## 2019-07-13 ENCOUNTER — Other Ambulatory Visit (HOSPITAL_COMMUNITY)
Admission: RE | Admit: 2019-07-13 | Discharge: 2019-07-13 | Disposition: A | Discharge status: Home / Self Care | Payer: BC Managed Care – PPO | Source: Ambulatory Visit | Attending: Obstetrics and Gynecology | Admitting: Obstetrics and Gynecology

## 2019-07-13 ENCOUNTER — Ambulatory Visit (INDEPENDENT_AMBULATORY_CARE_PROVIDER_SITE_OTHER): Payer: BC Managed Care – PPO | Admitting: Obstetrics and Gynecology

## 2019-07-13 ENCOUNTER — Ambulatory Visit
Admission: RE | Admit: 2019-07-13 | Discharge: 2019-07-13 | Disposition: A | Discharge status: Home / Self Care | Payer: BC Managed Care – PPO | Source: Ambulatory Visit | Attending: Obstetrics and Gynecology | Admitting: Obstetrics and Gynecology

## 2019-07-13 ENCOUNTER — Other Ambulatory Visit: Payer: Self-pay

## 2019-07-13 ENCOUNTER — Encounter: Payer: Self-pay | Admitting: Obstetrics and Gynecology

## 2019-07-13 VITALS — BP 143/72 | HR 74 | Ht 67.0 in | Wt 259.0 lb

## 2019-07-13 DIAGNOSIS — Z1231 Encounter for screening mammogram for malignant neoplasm of breast: Secondary | ICD-10-CM | POA: Diagnosis not present

## 2019-07-13 DIAGNOSIS — Z1151 Encounter for screening for human papillomavirus (HPV): Secondary | ICD-10-CM | POA: Insufficient documentation

## 2019-07-13 DIAGNOSIS — Z124 Encounter for screening for malignant neoplasm of cervix: Secondary | ICD-10-CM | POA: Diagnosis not present

## 2019-07-13 DIAGNOSIS — Z01419 Encounter for gynecological examination (general) (routine) without abnormal findings: Secondary | ICD-10-CM

## 2019-07-13 DIAGNOSIS — Z1239 Encounter for other screening for malignant neoplasm of breast: Secondary | ICD-10-CM | POA: Diagnosis present

## 2019-07-13 DIAGNOSIS — Z23 Encounter for immunization: Secondary | ICD-10-CM | POA: Diagnosis not present

## 2019-07-13 NOTE — Progress Notes (Signed)
Gynecology Annual Exam  PCP: Kirk Ruths, MD  Chief Complaint:  Chief Complaint  Patient presents with  . Gynecologic Exam    History of Present Illness:Patient is a 53 y.o. G3P0010 presents for annual exam. The patient has no complaints today.   LMP: No LMP recorded. (Menstrual status: Perimenopausal). No PMB  The patient is sexually active. She denies dyspareunia.  The patient does perform self breast exams.  There is notable family history of breast or ovarian cancer in her family (maternal aunt breast cancer).  The patient wears seatbelts: yes.   The patient has regular exercise: not asked.    The patient denies current symptoms of depression.     Review of Systems: Review of Systems  Constitutional: Negative for chills and fever.  HENT: Negative for congestion.   Respiratory: Negative for cough and shortness of breath.   Cardiovascular: Negative for chest pain and palpitations.  Gastrointestinal: Negative for abdominal pain, constipation, diarrhea, heartburn, nausea and vomiting.  Genitourinary: Negative for dysuria, frequency and urgency.  Skin: Negative for itching and rash.  Neurological: Negative for dizziness and headaches.  Endo/Heme/Allergies: Negative for polydipsia.  Psychiatric/Behavioral: Negative for depression. The patient has insomnia.     Past Medical History:  Past Medical History:  Diagnosis Date  . Hyperlipemia   . Hypertension   . Joint pain   . Melanoma ([place]) 1997    Past Surgical History:  Past Surgical History:  Procedure Laterality Date  . Achiles tendon    . bone spur Right    heel  . DILATION AND CURETTAGE OF UTERUS  2013   with ablation  . SKIN CANCER EXCISION Left 1997   melonoma /upper thigh x2  . SKIN LESION EXCISION Left ?   elbow/pre cancerous per pt    Gynecologic History:  No LMP recorded. (Menstrual status: Perimenopausal). Last mammogram: 08/01/2016 Results were: BI-RAD II  Obstetric History:  G3P0010  Family History:  Family History  Problem Relation Age of Onset  . Seizures Father   . Cancer Father        Brain  . Breast cancer Maternal Aunt 50    Social History:  Social History   Socioeconomic History  . Marital status: Married    Spouse name: Not on file  . Number of children: Not on file  . Years of education: Not on file  . Highest education level: Not on file  Occupational History  . Not on file  Tobacco Use  . Smoking status: Never Smoker  . Smokeless tobacco: Never Used  Substance and Sexual Activity  . Alcohol use: Yes    Alcohol/week: 0.0 standard drinks    Comment: occasionally  . Drug use: No  . Sexual activity: Yes  Other Topics Concern  . Not on file  Social History Narrative  . Not on file   Social Determinants of Health   Financial Resource Strain:   . Difficulty of Paying Living Expenses: Not on file  Food Insecurity:   . Worried About Charity fundraiser in the Last Year: Not on file  . Ran Out of Food in the Last Year: Not on file  Transportation Needs:   . Lack of Transportation (Medical): Not on file  . Lack of Transportation (Non-Medical): Not on file  Physical Activity:   . Days of Exercise per Week: Not on file  . Minutes of Exercise per Session: Not on file  Stress:   . Feeling of Stress : Not  on file  Social Connections:   . Frequency of Communication with Friends and Family: Not on file  . Frequency of Social Gatherings with Friends and Family: Not on file  . Attends Religious Services: Not on file  . Active Member of Clubs or Organizations: Not on file  . Attends Archivist Meetings: Not on file  . Marital Status: Not on file  Intimate Partner Violence:   . Fear of Current or Ex-Partner: Not on file  . Emotionally Abused: Not on file  . Physically Abused: Not on file  . Sexually Abused: Not on file    Allergies:  No Known Allergies  Medications: Prior to Admission medications   Medication Sig Start  Date End Date Taking? Authorizing Provider  aspirin 81 MG chewable tablet Chew by mouth.    [provider]  Cholecalciferol (VITAMIN D3) 2000 UNITS capsule Take by mouth.    [provider]  citalopram (CELEXA) 20 MG tablet Take by mouth.    [provider]  metoprolol (TOPROL-XL) 200 MG 24 hr tablet Take 200 mg by mouth daily.  12/28/13 12/08/18  [provider]  olmesartan-hydrochlorothiazide (BENICAR HCT) 40-25 MG tablet  10/29/18   [provider]  ondansetron (ZOFRAN) 4 MG tablet Take 1 tablet (4 mg total) by mouth every 8 (eight) hours as needed. 03/24/19   Hyatt, Max T, DPM  phentermine (ADIPEX-P) 37.5 MG tablet Take by mouth. 02/21/19   [provider]  Pyridoxine HCl (VITAMIN B-6 PO) Take by mouth daily.    [provider]  traZODone (DESYREL) 50 MG tablet  11/25/18   [provider]    Physical Exam Vitals: Blood pressure (!) 143/72, pulse 74, height 5\' 7"  (1.702 m), weight 259 lb (117.5 kg).   General: NAD HEENT: normocephalic, anicteric Thyroid: no enlargement, no palpable nodules Pulmonary: No increased work of breathing, CTAB Cardiovascular: RRR, distal pulses 2+ Breast: Breast symmetrical, no tenderness, no palpable nodules or masses, no skin or nipple retraction present, no nipple discharge.  No axillary or supraclavicular lymphadenopathy. Abdomen: NABS, soft, non-tender, non-distended.  Umbilicus without lesions.  No hepatomegaly, splenomegaly or masses palpable. No evidence of hernia  Genitourinary:  External: Normal external female genitalia.  Normal urethral meatus, normal Bartholin's and Skene's glands.    Vagina: Normal vaginal mucosa, no evidence of prolapse.    Cervix: Grossly normal in appearance, no bleeding  Uterus: Non-enlarged, mobile, normal contour.  No CMT  Adnexa: ovaries non-enlarged, no adnexal masses  Rectal: deferred  Lymphatic: no evidence of inguinal lymphadenopathy Extremities: no  edema, erythema, or tenderness Neurologic: Grossly intact Psychiatric: mood appropriate, affect full  Female chaperone present for pelvic and breast  portions of the physical exam     Assessment: 53 y.o. G3P0010 routine annual exam  Plan: Problem List Items Addressed This Visit    None    Visit Diagnoses    Encounter for gynecological examination without abnormal finding    -  Primary   Screening for malignant neoplasm of cervix       Relevant Orders   Cytology - PAP   Breast screening       Relevant Orders   MM 3D SCREEN BREAST BILATERAL   Flu vaccine need       Relevant Orders   Flu Vaccine QUAD 36+ mos IM (Fluarix, Quad PF) (Completed)      1) Mammogram - recommend yearly screening mammogram.  Mammogram Was ordered today  2) STI screening  was notoffered and  therefore not obtained  3) ASCCP guidelines and rational discussed.  Patient opts for every 3 years screening interval  4) Osteoporosis  - per USPTF routine screening DEXA at age 64  5) Routine healthcare maintenance including cholesterol, diabetes screening discussed managed by PCP  6) Return in about 1 year (around 07/12/2020) for annual.    Malachy Mood, MD Mosetta Pigeon, [place] 07/13/2019, 8:19 AM

## 2019-07-13 NOTE — Patient Instructions (Signed)
[facility] [address] [place] [address]  [facility]  [address] [place] [address]  Phone: [phone]  

## 2019-07-14 LAB — CYTOLOGY - PAP
Comment: NEGATIVE
Diagnosis: NEGATIVE
High risk HPV: NEGATIVE

## 2019-07-27 ENCOUNTER — Encounter: Payer: Self-pay | Admitting: Podiatry

## 2019-07-27 ENCOUNTER — Ambulatory Visit: Payer: BC Managed Care – PPO | Admitting: Podiatry

## 2019-07-27 ENCOUNTER — Other Ambulatory Visit: Payer: Self-pay

## 2019-07-27 DIAGNOSIS — M25472 Effusion, left ankle: Secondary | ICD-10-CM | POA: Diagnosis not present

## 2019-07-27 DIAGNOSIS — I872 Venous insufficiency (chronic) (peripheral): Secondary | ICD-10-CM | POA: Diagnosis not present

## 2019-07-27 NOTE — Progress Notes (Signed)
She presents today date of surgery March 25, 2019 status post Achilles tenolysis gastroc recession calcaneal ostectomy tarsal exostectomy.  States that she is doing better though it could be doing a little bit better than it is.  Objective: Left foot demonstrates some tenderness and swelling with some warmth in his Achilles insertion site left heel.  She has good dorsiflexion plantarflexion against resistance.  Assessment: Well-healing Achilles tendon repair and dorsal tarsal exostectomy.  Date of surgery 03/25/2019.  Plan: Discussed etiology pathology conservative surgical therapies at this point continue compression hose application of Voltaren gel up to 3-4 times a day posteriorly I will follow-up with her in about 2 months.

## 2019-09-21 ENCOUNTER — Other Ambulatory Visit: Payer: Self-pay

## 2019-09-21 ENCOUNTER — Encounter: Payer: Self-pay | Admitting: Podiatry

## 2019-09-21 ENCOUNTER — Ambulatory Visit: Payer: BC Managed Care – PPO | Admitting: Podiatry

## 2019-09-21 VITALS — Temp 98.4°F

## 2019-09-21 DIAGNOSIS — I872 Venous insufficiency (chronic) (peripheral): Secondary | ICD-10-CM

## 2019-09-21 DIAGNOSIS — M25472 Effusion, left ankle: Secondary | ICD-10-CM | POA: Diagnosis not present

## 2019-09-21 NOTE — Progress Notes (Signed)
She presents today date of surgery 03/25/2019 status post Achilles tenolysis gastroc recession calcaneal ostectomy and tarsal exostectomy states that is still swells back here in the very back near the Achilles and is just weak feeling to have trouble going up steps.  She denies chest pain or calf pain.  Objective: Vital signs are stable she is alert and oriented x3.  Pulses are palpable.  She has good dorsiflexion well past 0 degrees as well as good strong plantarflexion.  She has no pain on palpation of the gastroc muscle and the sural distribution is normal.  Assessment: Swelling more than likely due to some lymphedema this present in the posterior aspect of the ankle.  Weakness is more than likely associated with the weak soleus muscle and obviously a gastroc recession.  Plan: Continue physical activities and this should resolve on its own.  I will follow-up with her in about 2 months just to reevaluate

## 2019-11-21 ENCOUNTER — Encounter: Payer: Self-pay | Admitting: Podiatry

## 2019-11-21 ENCOUNTER — Ambulatory Visit (INDEPENDENT_AMBULATORY_CARE_PROVIDER_SITE_OTHER): Payer: BC Managed Care – PPO | Admitting: Podiatry

## 2019-11-21 ENCOUNTER — Other Ambulatory Visit: Payer: Self-pay

## 2019-11-21 DIAGNOSIS — Z9889 Other specified postprocedural states: Secondary | ICD-10-CM | POA: Diagnosis not present

## 2019-11-21 DIAGNOSIS — S86012A Strain of left Achilles tendon, initial encounter: Secondary | ICD-10-CM

## 2019-11-21 NOTE — Progress Notes (Signed)
She presents today date of surgery 03/25/2019 status post Achilles tenolysis gastroc recession calcaneal osteotomy and tarsal exostectomy left.  She states that seems to be doing better all the time.  States that I am still weak in his left leg.  And she is concerned about what looks like her tendon on the lateral aspect of her right leg.  Objective: Vital signs are stable she is alert and oriented x3.  Pulses are palpable.  Right foot demonstrates an area of what appears to be a lipoma along the posterior lateral aspect of her right leg lateral to the Achilles tendon.  Is nontender on palpation and did discuss the possibility of removal for her.  Left leg demonstrates perfectly healed retrocalcaneal heel spur resection and gastroc recession.  No edema no erythema cellulitis drainage odor appears to be doing much better everything is resolving slowly.  Assessment: Well-healing surgical foot left lipoma right posterior lateral heel.  Plan: Discussed the possibility of removing the lipoma.  Also demonstrated and discussed with her heel lifts and stretches.  She understands this and is amendable to it to help increase her strength of her muscle.  I will follow-up with her in August.

## 2020-02-22 ENCOUNTER — Ambulatory Visit (INDEPENDENT_AMBULATORY_CARE_PROVIDER_SITE_OTHER): Payer: BC Managed Care – PPO | Admitting: Podiatry

## 2020-02-22 ENCOUNTER — Other Ambulatory Visit: Payer: Self-pay

## 2020-02-22 ENCOUNTER — Encounter: Payer: Self-pay | Admitting: Podiatry

## 2020-02-22 DIAGNOSIS — Z9889 Other specified postprocedural states: Secondary | ICD-10-CM

## 2020-02-22 DIAGNOSIS — S86012A Strain of left Achilles tendon, initial encounter: Secondary | ICD-10-CM

## 2020-02-22 NOTE — Progress Notes (Signed)
She presents today for postop visit date of surgery 03/25/2019 status post Achilles tenolysis gastroc recession calcaneal ostectomy and tarsal exostectomy left.  She states that is finally doing better and the pain is resolving.  Objective: Signs are stable she is alert and oriented x3.  Pulses are palpable.  Edema has come down considerably on the left foot she has good range of motion of the Achilles nontender on palpation.  She has swelling of the fat pads over the sinus tarsi and peroneal tendons.  Other than that she is doing quite well.  Assessment: Well-healing surgical foot.  Plan: Follow-up with me as needed.

## 2020-07-22 ENCOUNTER — Other Ambulatory Visit: Payer: Self-pay

## 2020-07-22 DIAGNOSIS — Z20822 Contact with and (suspected) exposure to covid-19: Secondary | ICD-10-CM

## 2020-07-24 LAB — NOVEL CORONAVIRUS, NAA: SARS-CoV-2, NAA: DETECTED — AB

## 2020-07-24 LAB — SARS-COV-2, NAA 2 DAY TAT

## 2020-08-24 ENCOUNTER — Other Ambulatory Visit (HOSPITAL_COMMUNITY): Payer: Self-pay | Admitting: Internal Medicine

## 2020-08-24 ENCOUNTER — Other Ambulatory Visit: Payer: Self-pay | Admitting: Internal Medicine

## 2020-08-24 DIAGNOSIS — R7989 Other specified abnormal findings of blood chemistry: Secondary | ICD-10-CM

## 2020-08-30 ENCOUNTER — Other Ambulatory Visit: Payer: Self-pay

## 2020-08-30 ENCOUNTER — Ambulatory Visit
Admission: RE | Admit: 2020-08-30 | Discharge: 2020-08-30 | Disposition: A | Discharge status: Home / Self Care | Payer: BC Managed Care – PPO | Source: Ambulatory Visit | Attending: Internal Medicine | Admitting: Internal Medicine

## 2020-08-30 DIAGNOSIS — R7989 Other specified abnormal findings of blood chemistry: Secondary | ICD-10-CM | POA: Insufficient documentation

## 2020-09-13 ENCOUNTER — Ambulatory Visit (INDEPENDENT_AMBULATORY_CARE_PROVIDER_SITE_OTHER): Payer: BC Managed Care – PPO | Admitting: Urology

## 2020-09-13 ENCOUNTER — Encounter: Payer: Self-pay | Admitting: Urology

## 2020-09-13 ENCOUNTER — Other Ambulatory Visit: Payer: Self-pay

## 2020-09-13 VITALS — BP 127/76 | HR 69 | Ht 67.0 in | Wt 236.0 lb

## 2020-09-13 DIAGNOSIS — R7989 Other specified abnormal findings of blood chemistry: Secondary | ICD-10-CM

## 2020-09-13 DIAGNOSIS — N281 Cyst of kidney, acquired: Secondary | ICD-10-CM | POA: Diagnosis not present

## 2020-09-13 LAB — BLADDER SCAN AMB NON-IMAGING

## 2020-09-13 NOTE — Progress Notes (Signed)
   09/13/20 1:19 PM   Haley Brennan 05/31/1966 [id number]  CC: Mildly elevated Cr, parapelvic renal cysts  HPI: I saw Haley Brennan in urology clinic for the above issues.  She is a healthy 55 year old female has had a slowly rising creatinine over the last few years from a baseline of 0.8 now to 1.1.  A renal ultrasound was performed by her PCP which showed no hydronephrosis, but parapelvic cyst.  She denies any flank pain, history of kidney stones, urinary symptoms, gross hematuria.  There is no prior cross-sectional imaging to review.  She denies any significant weight loss or weight gain.  UA today benign with 0-5 WBCs, 0-2 RBCs, no bacteria, nitrite negative, no leukocytes.  PMH: Past Medical History:  Diagnosis Date  . Hyperlipemia   . Hypertension   . Joint pain   . Melanoma ([place]) 1997    Surgical History: Past Surgical History:  Procedure Laterality Date  . Achiles tendon    . bone spur Right    heel  . DILATION AND CURETTAGE OF UTERUS  2013   with ablation  . SKIN CANCER EXCISION Left 1997   melonoma /upper thigh x2  . SKIN LESION EXCISION Left ?   elbow/pre cancerous per pt    Family History: Family History  Problem Relation Age of Onset  . Seizures Father   . Cancer Father        Brain  . Breast cancer Maternal Aunt 65    Social History:  reports that she has never smoked. She has never used smokeless tobacco. She reports current alcohol use. She reports that she does not use drugs.  Physical Exam: BP 127/76   Pulse 69   Ht 5\' 7"  (1.702 m)   Wt 236 lb (107 kg)   BMI 36.96 kg/m    Constitutional:  Alert and oriented, No acute distress. Cardiovascular: No clubbing, cyanosis, or edema. Respiratory: Normal respiratory effort, no increased work of breathing. GI: Abdomen is soft, nontender, nondistended, no abdominal masses   Laboratory Data: Reviewed, see HPI  Pertinent Imaging: I have personally viewed and interpreted the renal ultrasound  showing no hydronephrosis but parapelvic cysts.  Assessment & Plan:   55 year old female with slow and subtle rise in the creatinine, with renal ultrasound showing no hydronephrosis but parapelvic cyst.  We discussed this is a benign finding.  Her urinalysis is completely benign.  We discussed options including observation and monitoring the creatinine with referral to nephrology, versus consideration of more aggressive imaging with a CT urogram.  She would like to defer further imaging which I think is reasonable based on the benign ultrasound.  We discussed basic strategies of adequate hydration and avoiding nephrotoxic medications.  Can follow-up with urology as needed.  Haley Madrid, MD 09/13/2020  [facility] [address], [place], [place] [address] [phone]

## 2020-09-14 LAB — URINALYSIS, COMPLETE
Bilirubin, UA: NEGATIVE
Glucose, UA: NEGATIVE
Ketones, UA: NEGATIVE
Leukocytes,UA: NEGATIVE
Nitrite, UA: NEGATIVE
Protein,UA: NEGATIVE
RBC, UA: NEGATIVE
Specific Gravity, UA: 1.02 (ref 1.005–1.030)
Urobilinogen, Ur: 0.2 mg/dL (ref 0.2–1.0)
pH, UA: 5 (ref 5.0–7.5)

## 2020-09-14 LAB — MICROSCOPIC EXAMINATION: Bacteria, UA: NONE SEEN

## 2020-11-22 IMAGING — MG DIGITAL SCREENING BILAT W/ TOMO W/ CAD
8 series · 8 of 24 positions shown · non-contrast
Comparison: Previous exam(s).

CLINICAL DATA: Screening.

EXAM:
DIGITAL SCREENING BILATERAL MAMMOGRAM WITH TOMO AND CAD

[R CC synth-2D]
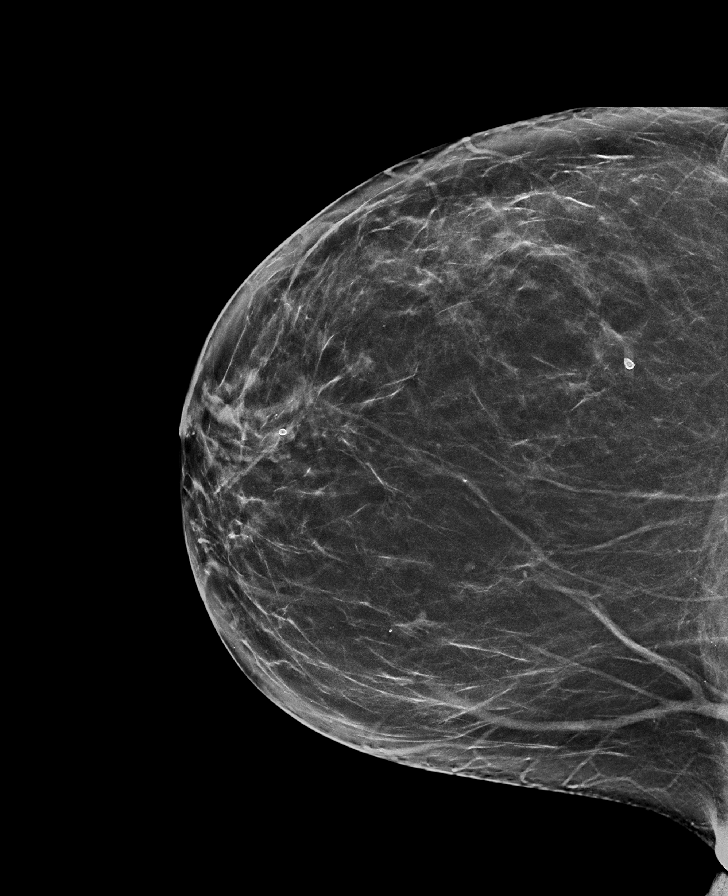

[L CC synth-2D]
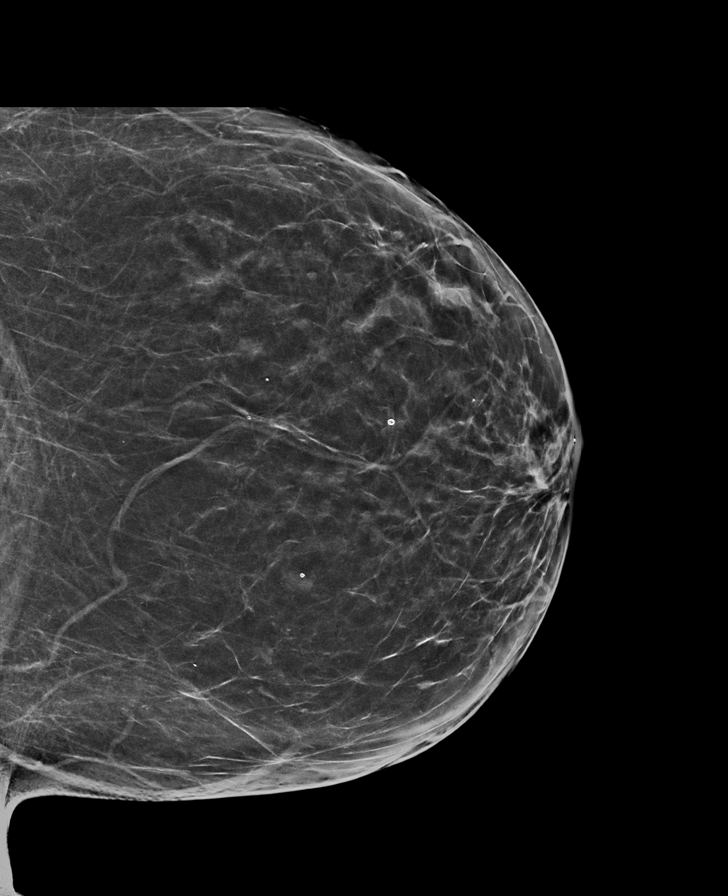

[R MLO synth-2D]
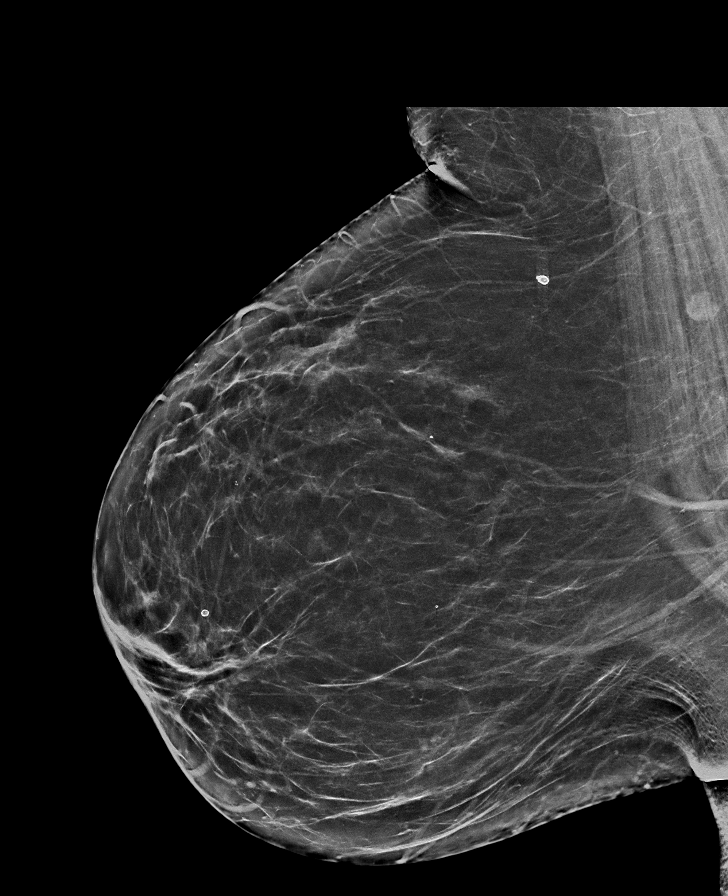

[L MLO synth-2D]
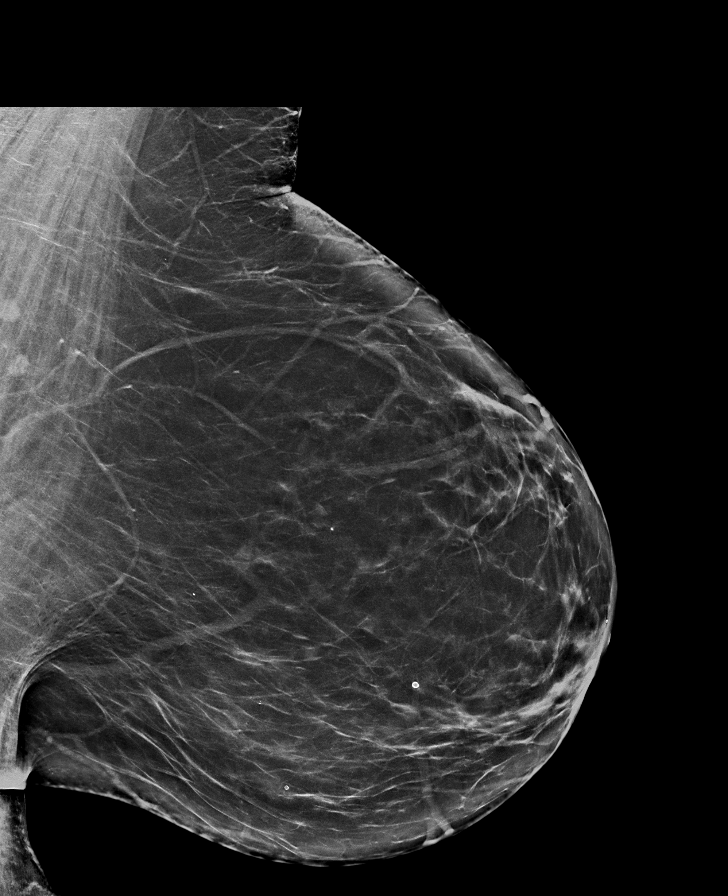

[L CC tomo · tomo slice 35/69.0]
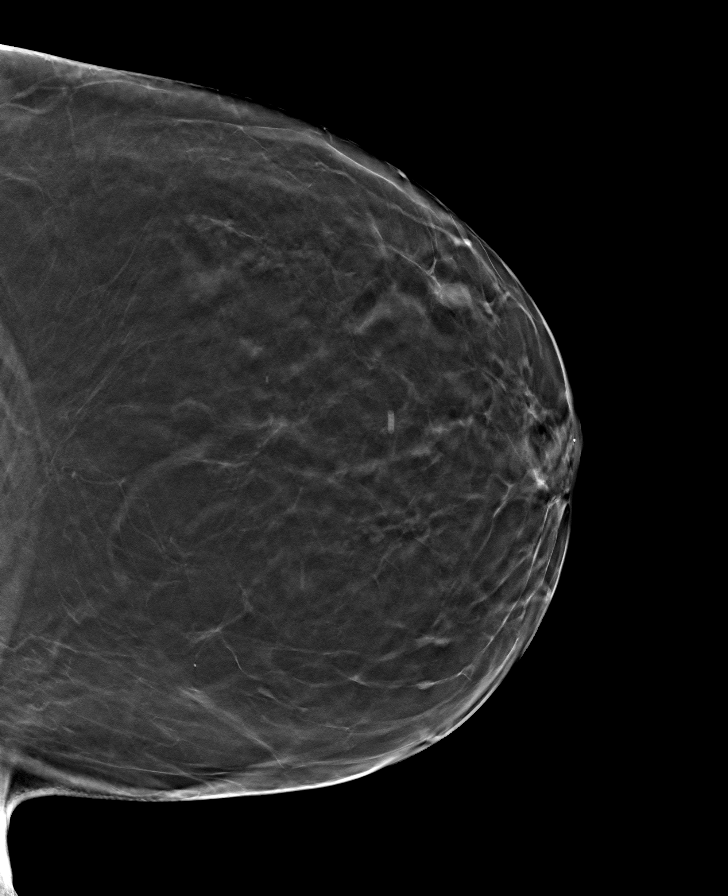

[R MLO tomo · tomo slice 42/83.0]
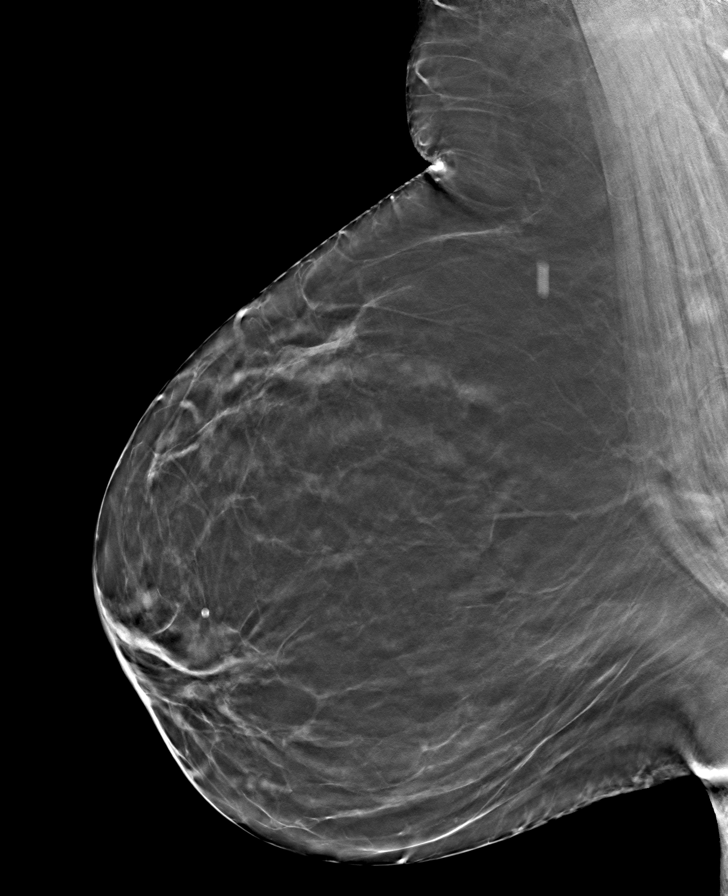

[L MLO tomo · tomo slice 43/86.0]
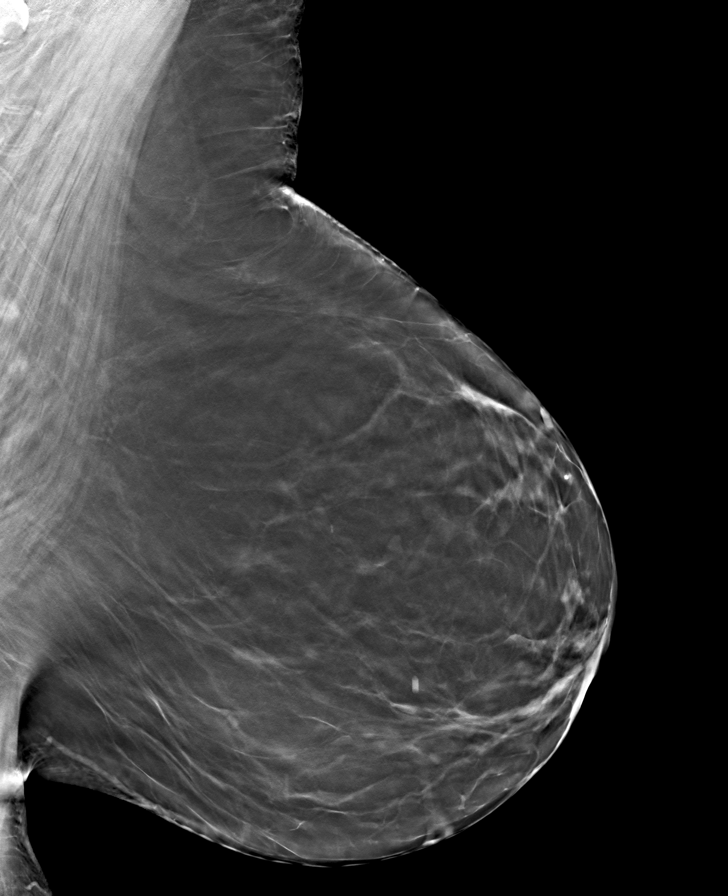

[R CC tomo · tomo slice 37/74.0]
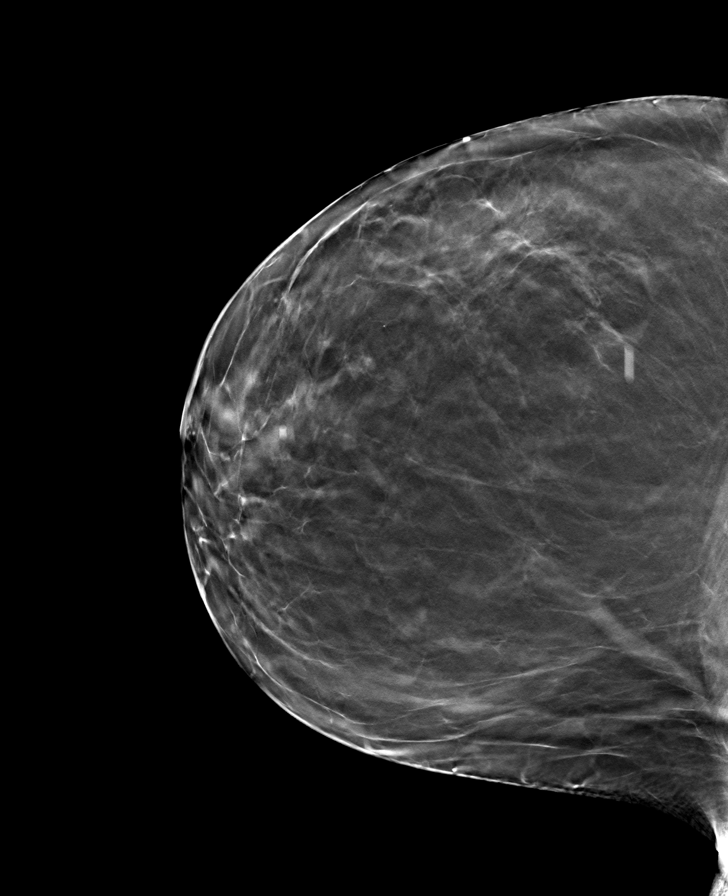

[8 of 24 positions shown; findings below may reference images not displayed]

ACR Breast Density Category b: There are scattered areas of
fibroglandular density.
FINDINGS: There are no findings suspicious for malignancy. Images were
processed with CAD.
IMPRESSION: No mammographic evidence of malignancy. A result letter of this
screening mammogram will be mailed directly to the patient.

RECOMMENDATION:
Screening mammogram in one year. (Code:[id number])

BI-RADS CATEGORY  1: Negative.

## 2021-06-21 ENCOUNTER — Other Ambulatory Visit: Payer: Self-pay | Admitting: Internal Medicine

## 2021-06-21 DIAGNOSIS — Z1231 Encounter for screening mammogram for malignant neoplasm of breast: Secondary | ICD-10-CM

## 2021-08-19 ENCOUNTER — Other Ambulatory Visit: Payer: Self-pay

## 2021-08-19 ENCOUNTER — Ambulatory Visit
Admission: RE | Admit: 2021-08-19 | Discharge: 2021-08-19 | Disposition: A | Discharge status: Home / Self Care | Payer: BC Managed Care – PPO | Source: Ambulatory Visit | Attending: Internal Medicine | Admitting: Internal Medicine

## 2021-08-19 DIAGNOSIS — Z1231 Encounter for screening mammogram for malignant neoplasm of breast: Secondary | ICD-10-CM | POA: Diagnosis present

## 2021-08-28 ENCOUNTER — Other Ambulatory Visit: Payer: Self-pay | Admitting: Internal Medicine

## 2021-08-28 DIAGNOSIS — R928 Other abnormal and inconclusive findings on diagnostic imaging of breast: Secondary | ICD-10-CM

## 2021-08-28 DIAGNOSIS — N6489 Other specified disorders of breast: Secondary | ICD-10-CM

## 2021-09-16 ENCOUNTER — Ambulatory Visit
Admission: RE | Admit: 2021-09-16 | Discharge: 2021-09-16 | Disposition: A | Discharge status: Home / Self Care | Payer: BC Managed Care – PPO | Source: Ambulatory Visit | Attending: Internal Medicine | Admitting: Internal Medicine

## 2021-09-16 ENCOUNTER — Other Ambulatory Visit: Payer: Self-pay

## 2021-09-16 DIAGNOSIS — N6489 Other specified disorders of breast: Secondary | ICD-10-CM

## 2021-09-16 DIAGNOSIS — R928 Other abnormal and inconclusive findings on diagnostic imaging of breast: Secondary | ICD-10-CM

## 2021-09-24 ENCOUNTER — Other Ambulatory Visit: Payer: Self-pay | Admitting: Internal Medicine

## 2021-09-24 DIAGNOSIS — R928 Other abnormal and inconclusive findings on diagnostic imaging of breast: Secondary | ICD-10-CM

## 2022-03-20 ENCOUNTER — Ambulatory Visit
Admission: RE | Admit: 2022-03-20 | Discharge: 2022-03-20 | Disposition: A | Discharge status: Home / Self Care | Payer: BC Managed Care – PPO | Source: Ambulatory Visit | Attending: Internal Medicine | Admitting: Internal Medicine

## 2022-03-20 DIAGNOSIS — R928 Other abnormal and inconclusive findings on diagnostic imaging of breast: Secondary | ICD-10-CM | POA: Diagnosis present

## 2022-05-12 ENCOUNTER — Encounter (INDEPENDENT_AMBULATORY_CARE_PROVIDER_SITE_OTHER): Payer: Self-pay

## 2022-08-19 ENCOUNTER — Other Ambulatory Visit (INDEPENDENT_AMBULATORY_CARE_PROVIDER_SITE_OTHER): Payer: Self-pay | Admitting: Vascular Surgery

## 2022-08-19 DIAGNOSIS — I6523 Occlusion and stenosis of bilateral carotid arteries: Secondary | ICD-10-CM

## 2022-08-22 ENCOUNTER — Ambulatory Visit (INDEPENDENT_AMBULATORY_CARE_PROVIDER_SITE_OTHER): Payer: BC Managed Care – PPO

## 2022-08-22 ENCOUNTER — Ambulatory Visit (INDEPENDENT_AMBULATORY_CARE_PROVIDER_SITE_OTHER): Payer: BC Managed Care – PPO | Admitting: Nurse Practitioner

## 2022-08-22 ENCOUNTER — Encounter (INDEPENDENT_AMBULATORY_CARE_PROVIDER_SITE_OTHER): Payer: Self-pay | Admitting: Vascular Surgery

## 2022-08-22 VITALS — BP 117/80 | HR 55 | Resp 18 | Ht 68.0 in | Wt 232.0 lb

## 2022-08-22 DIAGNOSIS — I6523 Occlusion and stenosis of bilateral carotid arteries: Secondary | ICD-10-CM

## 2022-08-22 DIAGNOSIS — I10 Essential (primary) hypertension: Secondary | ICD-10-CM

## 2022-08-22 DIAGNOSIS — E785 Hyperlipidemia, unspecified: Secondary | ICD-10-CM

## 2022-09-14 ENCOUNTER — Encounter (INDEPENDENT_AMBULATORY_CARE_PROVIDER_SITE_OTHER): Payer: Self-pay | Admitting: Nurse Practitioner

## 2022-09-14 NOTE — Progress Notes (Incomplete)
Subjective:    Patient ID: Haley Brennan, female    DOB: 11-26-1965, 57 y.o.   MRN: [id number] Chief Complaint  Patient presents with  . New Patient (Initial Visit)    np. carotid + consult. carotid artery disease. referred by fath,    The patient is seen for evaluation of carotid stenosis. The carotid stenosis was identified after carotid duplex with Dr. Ubaldo Glassing in September.  The patient denies amaurosis fugax. There is no recent history of TIA symptoms or focal motor deficits. There is no prior documented CVA.  There is no history of migraine headaches. There is no history of seizures.  The patient is taking enteric-coated aspirin 81 mg daily.  No recent shortening of the patient's walking distance or new symptoms consistent with claudication.  No history of rest pain symptoms. No new ulcers or wounds of the lower extremities have occurred.  There is no history of DVT, PE or superficial thrombophlebitis. No recent episodes of angina or shortness of breath documented.      Review of Systems     Objective:   Physical Exam  BP 117/80 (BP Location: Left Arm)   Pulse (!) 55   Resp 18   Ht '5\' 8"'$  (1.727 m)   Wt 232 lb (105.2 kg)   BMI 35.28 kg/m   Past Medical History:  Diagnosis Date  . Hyperlipemia   . Hypertension   . Joint pain   . Melanoma ([place]) 1997    Social History   Socioeconomic History  . Marital status: Married    Spouse name: Not on file  . Number of children: Not on file  . Years of education: Not on file  . Highest education level: Not on file  Occupational History  . Not on file  Tobacco Use  . Smoking status: Never  . Smokeless tobacco: Never  Substance and Sexual Activity  . Alcohol use: Yes    Alcohol/week: 0.0 standard drinks of alcohol    Comment: occasionally  . Drug use: No  . Sexual activity: Yes  Other Topics Concern  . Not on file  Social History Narrative  . Not on file   Social Determinants of Health   Financial  Resource Strain: Not on file  Food Insecurity: Not on file  Transportation Needs: Not on file  Physical Activity: Not on file  Stress: Not on file  Social Connections: Not on file  Intimate Partner Violence: Not on file    Past Surgical History:  Procedure Laterality Date  . Achiles tendon    . bone spur Right    heel  . DILATION AND CURETTAGE OF UTERUS  2013   with ablation  . SKIN CANCER EXCISION Left 1997   melonoma /upper thigh x2  . SKIN LESION EXCISION Left ?   elbow/pre cancerous per pt    Family History  Problem Relation Age of Onset  . Seizures Father   . Cancer Father        Brain  . Breast cancer Maternal Aunt 50    No Known Allergies      No data to display            CMP  No results found for: "NA", "K", "CL", "CO2", "GLUCOSE", "BUN", "CREATININE", "CALCIUM", "PROT", "ALBUMIN", "AST", "ALT", "ALKPHOS", "BILITOT", "GFRNONAA", "GFRAA"   No results found.     Assessment & Plan:   1. Bilateral carotid artery stenosis Recommend:  Given the patient's asymptomatic subcritical stenosis no further invasive testing  or surgery at this time.  Duplex ultrasound shows 40 to 59% on the right ICA with 1 to 39% of the left   Continue antiplatelet therapy as prescribed Continue management of CAD, HTN and Hyperlipidemia Healthy heart diet,  encouraged exercise at least 4 times per week Follow up in 12 months with duplex ultrasound and physical exam   2. HTN (hypertension), benign Continue antihypertensive medications as already ordered, these medications have been reviewed and there are no changes at this time.  3. Hyperlipidemia, unspecified hyperlipidemia type Continue statin as ordered and reviewed, no changes at this time   Current Outpatient Medications on File Prior to Visit  Medication Sig Dispense Refill  . aspirin 81 MG chewable tablet Chew by mouth.    . Cholecalciferol (VITAMIN D3) 2000 UNITS capsule Take by mouth.    . citalopram (CELEXA)  20 MG tablet Take by mouth.    . metoprolol (TOPROL-XL) 200 MG 24 hr tablet Take 200 mg by mouth daily.     Marland Kitchen olmesartan-hydrochlorothiazide (BENICAR HCT) 40-25 MG tablet     . Pyridoxine HCl (VITAMIN B-6 PO) Take by mouth daily.    . rosuvastatin (CRESTOR) 40 MG tablet Take 40 mg by mouth daily.    Marland Kitchen tiZANidine (ZANAFLEX) 4 MG tablet Take 1 tablet every day by oral route at bedtime.    . topiramate (TOPAMAX) 25 MG tablet Take 1 tablet by mouth 2 (two) times daily.    . traZODone (DESYREL) 50 MG tablet     . phentermine (ADIPEX-P) 37.5 MG tablet Take by mouth. (Patient not taking: Reported on 08/22/2022)     No current facility-administered medications on file prior to visit.    There are no Patient Instructions on file for this visit. No follow-ups on file.   Kris Hartmann, NP

## 2022-09-14 NOTE — Progress Notes (Signed)
Subjective:    Patient ID: Haley Brennan, female    DOB: 01/14/1966, 57 y.o.   MRN: [id number] Chief Complaint  Patient presents with   New Patient (Initial Visit)    np. carotid + consult. carotid artery disease. referred by fath,    The patient is seen for evaluation of carotid stenosis. The carotid stenosis was identified after carotid duplex with Dr. Ubaldo Glassing in September.  The patient denies amaurosis fugax. There is no recent history of TIA symptoms or focal motor deficits. There is no prior documented CVA.    There is no history of migraine headaches. There is no history of seizures.  The patient is taking enteric-coated aspirin 81 mg daily.  No recent shortening of the patient's walking distance or new symptoms consistent with claudication.  No history of rest pain symptoms. No new ulcers or wounds of the lower extremities have occurred.  There is no history of DVT, PE or superficial thrombophlebitis. No recent episodes of angina or shortness of breath documented.   Today noninvasive studies show 1 to 39% stenosis of the left ICA with 40 to 59% stenosis of the right ICA.  Antegrade flow in the bilateral vertebral arteries with normal flow hemodynamics in the bilateral subclavian arteries.    Review of Systems  All other systems reviewed and are negative.      Objective:   Physical Exam Vitals reviewed.  HENT:     Head: Normocephalic.  Neck:     Vascular: No carotid bruit.  Cardiovascular:     Rate and Rhythm: Normal rate.     Pulses:          Radial pulses are 2+ on the right side and 2+ on the left side.  Pulmonary:     Effort: Pulmonary effort is normal.  Skin:    General: Skin is warm and dry.  Neurological:     Mental Status: She is alert and oriented to person, place, and time.  Psychiatric:        Mood and Affect: Mood normal.        Behavior: Behavior normal.        Thought Content: Thought content normal.        Judgment: Judgment normal.      BP 117/80 (BP Location: Left Arm)   Pulse (!) 55   Resp 18   Ht '5\' 8"'$  (1.727 m)   Wt 232 lb (105.2 kg)   BMI 35.28 kg/m   Past Medical History:  Diagnosis Date   Hyperlipemia    Hypertension    Joint pain    Melanoma ([address]) 1997    Social History   Socioeconomic History   Marital status: Married    Spouse name: Not on file   Number of children: Not on file   Years of education: Not on file   Highest education level: Not on file  Occupational History   Not on file  Tobacco Use   Smoking status: Never   Smokeless tobacco: Never  Substance and Sexual Activity   Alcohol use: Yes    Alcohol/week: 0.0 standard drinks of alcohol    Comment: occasionally   Drug use: No   Sexual activity: Yes  Other Topics Concern   Not on file  Social History Narrative   Not on file   Social Determinants of Health   Financial Resource Strain: Not on file  Food Insecurity: Not on file  Transportation Needs: Not on file  Physical Activity: Not on  file  Stress: Not on file  Social Connections: Not on file  Intimate Partner Violence: Not on file    Past Surgical History:  Procedure Laterality Date   Achiles tendon     bone spur Right    heel   [place] OF UTERUS  2013   with ablation   SKIN CANCER EXCISION Left 1997   melonoma /upper thigh x2   SKIN LESION EXCISION Left ?   elbow/pre cancerous per pt    Family History  Problem Relation Age of Onset   Seizures Father    Cancer Father        Brain   Breast cancer Maternal Aunt 50    No Known Allergies      No data to display            CMP  No results found for: "NA", "K", "CL", "CO2", "GLUCOSE", "BUN", "CREATININE", "CALCIUM", "PROT", "ALBUMIN", "AST", "ALT", "ALKPHOS", "BILITOT", "GFRNONAA", "GFRAA"   No results found.     Assessment & Plan:   1. Bilateral carotid artery stenosis Recommend:  Given the patient's asymptomatic subcritical stenosis no further invasive testing or  surgery at this time.  Duplex ultrasound shows 40 to 59% on the right ICA with 1 to 39% of the left   Continue antiplatelet therapy as prescribed Continue management of CAD, HTN and Hyperlipidemia Healthy heart diet,  encouraged exercise at least 4 times per week Follow up in 12 months with duplex ultrasound and physical exam   2. HTN (hypertension), benign Continue antihypertensive medications as already ordered, these medications have been reviewed and there are no changes at this time.  3. Hyperlipidemia, unspecified hyperlipidemia type Continue statin as ordered and reviewed, no changes at this time   Current Outpatient Medications on File Prior to Visit  Medication Sig Dispense Refill   aspirin 81 MG chewable tablet Chew by mouth.     Cholecalciferol (VITAMIN D3) 2000 UNITS capsule Take by mouth.     citalopram (CELEXA) 20 MG tablet Take by mouth.     metoprolol (TOPROL-XL) 200 MG 24 hr tablet Take 200 mg by mouth daily.      olmesartan-hydrochlorothiazide (BENICAR HCT) 40-25 MG tablet      Pyridoxine HCl (VITAMIN B-6 PO) Take by mouth daily.     rosuvastatin (CRESTOR) 40 MG tablet Take 40 mg by mouth daily.     tiZANidine (ZANAFLEX) 4 MG tablet Take 1 tablet every day by oral route at bedtime.     topiramate (TOPAMAX) 25 MG tablet Take 1 tablet by mouth 2 (two) times daily.     traZODone (DESYREL) 50 MG tablet      phentermine (ADIPEX-P) 37.5 MG tablet Take by mouth. (Patient not taking: Reported on 08/22/2022)     No current facility-administered medications on file prior to visit.    There are no Patient Instructions on file for this visit. No follow-ups on file.   Kris Hartmann, NP

## 2023-02-26 ENCOUNTER — Encounter (INDEPENDENT_AMBULATORY_CARE_PROVIDER_SITE_OTHER): Payer: Self-pay | Admitting: Nurse Practitioner

## 2023-03-10 ENCOUNTER — Other Ambulatory Visit: Payer: Self-pay | Admitting: Internal Medicine

## 2023-03-10 DIAGNOSIS — Z1231 Encounter for screening mammogram for malignant neoplasm of breast: Secondary | ICD-10-CM

## 2023-05-05 ENCOUNTER — Ambulatory Visit
Admission: RE | Admit: 2023-05-05 | Discharge: 2023-05-05 | Disposition: A | Discharge status: Home / Self Care | Payer: BC Managed Care – PPO | Source: Ambulatory Visit | Attending: Internal Medicine | Admitting: Internal Medicine

## 2023-05-05 DIAGNOSIS — Z1231 Encounter for screening mammogram for malignant neoplasm of breast: Secondary | ICD-10-CM | POA: Diagnosis present

## 2023-08-26 ENCOUNTER — Other Ambulatory Visit (INDEPENDENT_AMBULATORY_CARE_PROVIDER_SITE_OTHER): Payer: Self-pay | Admitting: Vascular Surgery

## 2023-08-26 DIAGNOSIS — I6523 Occlusion and stenosis of bilateral carotid arteries: Secondary | ICD-10-CM

## 2023-08-28 ENCOUNTER — Encounter (INDEPENDENT_AMBULATORY_CARE_PROVIDER_SITE_OTHER): Payer: Self-pay | Admitting: Vascular Surgery

## 2023-08-28 ENCOUNTER — Ambulatory Visit (INDEPENDENT_AMBULATORY_CARE_PROVIDER_SITE_OTHER): Payer: 59 | Admitting: Vascular Surgery

## 2023-08-28 ENCOUNTER — Ambulatory Visit (INDEPENDENT_AMBULATORY_CARE_PROVIDER_SITE_OTHER): Payer: BC Managed Care – PPO

## 2023-08-28 VITALS — BP 115/81 | HR 55 | Resp 18 | Ht 68.0 in | Wt 254.4 lb

## 2023-08-28 DIAGNOSIS — I6523 Occlusion and stenosis of bilateral carotid arteries: Secondary | ICD-10-CM | POA: Diagnosis not present

## 2023-08-28 DIAGNOSIS — I6529 Occlusion and stenosis of unspecified carotid artery: Secondary | ICD-10-CM | POA: Insufficient documentation

## 2023-08-28 DIAGNOSIS — I10 Essential (primary) hypertension: Secondary | ICD-10-CM | POA: Diagnosis not present

## 2023-08-28 DIAGNOSIS — E785 Hyperlipidemia, unspecified: Secondary | ICD-10-CM | POA: Diagnosis not present

## 2023-08-28 NOTE — Assessment & Plan Note (Signed)
lipid control important in reducing the progression of atherosclerotic disease. Continue statin therapy

## 2023-08-28 NOTE — Assessment & Plan Note (Signed)
Carotid duplex today shows stable stenosis just into the 40 to 59% range on the right and in the 1 to 39% range on the left.  Continue Crestor and aspirin.  Follow-up in 1 year.

## 2023-08-28 NOTE — Progress Notes (Signed)
MRN : [id number]  Haley Brennan is a 58 y.o. (September 27, 1965) female who presents with chief complaint of  Chief Complaint  Patient presents with   Follow-up    f/u in 1 year with carotid  .  History of Present Illness: Patient returns in follow-up of her carotid disease.  She is doing well.  No focal neurologic symptoms or problems since her last visit 1 year ago. Specifically, the patient denies amaurosis fugax, speech or swallowing difficulties, or arm or leg weakness or numbness.  Carotid duplex today shows stable stenosis just into the 40 to 59% range on the right and in the 1 to 39% range on the left.  Current Outpatient Medications  Medication Sig Dispense Refill   aspirin 81 MG chewable tablet Chew by mouth.     Cholecalciferol (VITAMIN D3) 2000 UNITS capsule Take by mouth.     citalopram (CELEXA) 20 MG tablet Take by mouth.     metoprolol (TOPROL-XL) 200 MG 24 hr tablet Take 200 mg by mouth daily.      olmesartan-hydrochlorothiazide (BENICAR HCT) 40-25 MG tablet      Pyridoxine HCl (VITAMIN B-6 PO) Take by mouth daily.     rosuvastatin (CRESTOR) 40 MG tablet Take 40 mg by mouth daily.     tiZANidine (ZANAFLEX) 4 MG tablet Take 1 tablet every day by oral route at bedtime.     topiramate (TOPAMAX) 25 MG tablet Take 1 tablet by mouth 2 (two) times daily.     traZODone (DESYREL) 50 MG tablet      phentermine (ADIPEX-P) 37.5 MG tablet Take by mouth.     No current facility-administered medications for this visit.    Past Medical History:  Diagnosis Date   Hyperlipemia    Hypertension    Joint pain    Melanoma ([facility]) 1997    Past Surgical History:  Procedure Laterality Date   Achiles tendon     bone spur Right    heel   DILATION AND CURETTAGE OF UTERUS  2013   with ablation   SKIN CANCER EXCISION Left 1997   melonoma /upper thigh x2   SKIN LESION EXCISION Left ?   elbow/pre cancerous per pt     Social History   Tobacco Use   Smoking status: Never    Smokeless tobacco: Never  Substance Use Topics   Alcohol use: Yes    Alcohol/week: 0.0 standard drinks of alcohol    Comment: occasionally   Drug use: No      Family History  Problem Relation Age of Onset   Seizures Father    Cancer Father        Brain   Breast cancer Maternal Aunt 50     No Known Allergies   REVIEW OF SYSTEMS (Negative unless checked)  Constitutional: [] Weight loss  [] Fever  [] Chills Cardiac: [] Chest pain   [] Chest pressure   [] Palpitations   [] Shortness of breath when laying flat   [] Shortness of breath at rest   [] Shortness of breath with exertion. Vascular:  [] Pain in legs with walking   [] Pain in legs at rest   [] Pain in legs when laying flat   [] Claudication   [] Pain in feet when walking  [] Pain in feet at rest  [] Pain in feet when laying flat   [] History of DVT   [] Phlebitis   [] Swelling in legs   [] Varicose veins   [] Non-healing ulcers Pulmonary:   [] Uses home oxygen   [] Productive cough   [] Hemoptysis   []   Wheeze  [] COPD   [] Asthma Neurologic:  [] Dizziness  [] Blackouts   [] Seizures   [] History of stroke   [] History of TIA  [] Aphasia   [] Temporary blindness   [] Dysphagia   [] Weakness or numbness in arms   [] Weakness or numbness in legs Musculoskeletal:  [] Arthritis   [] Joint swelling   [x] Joint pain   [] Low back pain Hematologic:  [] Easy bruising  [] Easy bleeding   [] Hypercoagulable state   [] Anemic  [] Hepatitis Gastrointestinal:  [] Blood in stool   [] Vomiting blood  [] Gastroesophageal reflux/heartburn   [] Difficulty swallowing. Genitourinary:  [] Chronic kidney disease   [] Difficult urination  [] Frequent urination  [] Burning with urination   [] Blood in urine Skin:  [] Rashes   [] Ulcers   [] Wounds Psychological:  [] History of anxiety   []  History of major depression.  Physical Examination  Vitals:   08/28/23 0949  BP: 115/81  Pulse: (!) 55  Resp: 18  Weight: 254 lb 6.4 oz (115.4 kg)  Height: 5\' 8"  (1.727 m)   Body mass index is 38.68 kg/m. Gen:   WD/WN, NAD Head: [place]/AT, No temporalis wasting. Ear/Nose/Throat: Hearing grossly intact, nares w/o erythema or drainage, trachea midline Eyes: Conjunctiva clear. Sclera non-icteric Neck: Supple.  No bruit  Pulmonary:  Good air movement, equal and clear to auscultation bilaterally.  Cardiac: RRR, No JVD Vascular:  Vessel Right Left  Radial Palpable Palpable           Musculoskeletal: M/S 5/5 throughout.  No deformity or atrophy. No edema. Neurologic: CN 2-12 intact. Sensation grossly intact in extremities.  Symmetrical.  Speech is fluent. Motor exam as listed above. Psychiatric: Judgment intact, Mood & affect appropriate for pt's clinical situation. Dermatologic: No rashes or ulcers noted.  No cellulitis or open wounds.     CBC No results found for: "WBC", "HGB", "HCT", "MCV", "PLT"  BMET No results found for: "NA", "K", "CL", "CO2", "GLUCOSE", "BUN", "CREATININE", "CALCIUM", "GFRNONAA", "GFRAA" CrCl cannot be calculated (No successful lab value found.).  COAG No results found for: "INR", "PROTIME"  Radiology No results found.   Assessment/Plan HTN (hypertension), benign blood pressure control important in reducing the progression of atherosclerotic disease. On appropriate oral medications.   Hyperlipidemia lipid control important in reducing the progression of atherosclerotic disease. Continue statin therapy   Carotid stenosis Carotid duplex today shows stable stenosis just into the 40 to 59% range on the right and in the 1 to 39% range on the left.  Continue Crestor and aspirin.  Follow-up in 1 year.    Festus Barren, MD  08/28/2023 11:39 AM    This note was created with Dragon medical transcription system.  Any errors from dictation are purely unintentional

## 2023-08-28 NOTE — Assessment & Plan Note (Signed)
blood pressure control important in reducing the progression of atherosclerotic disease. On appropriate oral medications.

## 2023-12-01 ENCOUNTER — Encounter (INDEPENDENT_AMBULATORY_CARE_PROVIDER_SITE_OTHER): Payer: Self-pay

## 2024-04-20 ENCOUNTER — Emergency Department

## 2024-04-20 ENCOUNTER — Inpatient Hospital Stay
Admission: EM | Admit: 2024-04-20 | Discharge: 2024-04-26 | DRG: 439 | Disposition: A | Discharge status: Home / Self Care | Attending: Hospitalist | Admitting: Hospitalist

## 2024-04-20 ENCOUNTER — Encounter: Payer: Self-pay | Admitting: *Deleted

## 2024-04-20 ENCOUNTER — Other Ambulatory Visit: Payer: Self-pay

## 2024-04-20 DIAGNOSIS — Z79899 Other long term (current) drug therapy: Secondary | ICD-10-CM

## 2024-04-20 DIAGNOSIS — E785 Hyperlipidemia, unspecified: Secondary | ICD-10-CM | POA: Diagnosis present

## 2024-04-20 DIAGNOSIS — Z6838 Body mass index (BMI) 38.0-38.9, adult: Secondary | ICD-10-CM

## 2024-04-20 DIAGNOSIS — K859 Acute pancreatitis without necrosis or infection, unspecified: Principal | ICD-10-CM | POA: Diagnosis present

## 2024-04-20 DIAGNOSIS — Z6841 Body Mass Index (BMI) 40.0 and over, adult: Secondary | ICD-10-CM

## 2024-04-20 DIAGNOSIS — K5712 Diverticulitis of small intestine without perforation or abscess without bleeding: Secondary | ICD-10-CM | POA: Diagnosis present

## 2024-04-20 DIAGNOSIS — N1831 Chronic kidney disease, stage 3a: Secondary | ICD-10-CM | POA: Diagnosis present

## 2024-04-20 DIAGNOSIS — E876 Hypokalemia: Secondary | ICD-10-CM | POA: Diagnosis present

## 2024-04-20 DIAGNOSIS — Z803 Family history of malignant neoplasm of breast: Secondary | ICD-10-CM

## 2024-04-20 DIAGNOSIS — I10 Essential (primary) hypertension: Secondary | ICD-10-CM | POA: Diagnosis present

## 2024-04-20 DIAGNOSIS — Z7984 Long term (current) use of oral hypoglycemic drugs: Secondary | ICD-10-CM

## 2024-04-20 DIAGNOSIS — Z820 Family history of epilepsy and other diseases of the nervous system: Secondary | ICD-10-CM

## 2024-04-20 DIAGNOSIS — K567 Ileus, unspecified: Secondary | ICD-10-CM | POA: Diagnosis not present

## 2024-04-20 DIAGNOSIS — K5710 Diverticulosis of small intestine without perforation or abscess without bleeding: Secondary | ICD-10-CM

## 2024-04-20 DIAGNOSIS — Z9889 Other specified postprocedural states: Secondary | ICD-10-CM

## 2024-04-20 DIAGNOSIS — K8500 Idiopathic acute pancreatitis without necrosis or infection: Secondary | ICD-10-CM | POA: Diagnosis not present

## 2024-04-20 DIAGNOSIS — Z85820 Personal history of malignant melanoma of skin: Secondary | ICD-10-CM

## 2024-04-20 DIAGNOSIS — K56600 Partial intestinal obstruction, unspecified as to cause: Secondary | ICD-10-CM | POA: Diagnosis not present

## 2024-04-20 DIAGNOSIS — I129 Hypertensive chronic kidney disease with stage 1 through stage 4 chronic kidney disease, or unspecified chronic kidney disease: Secondary | ICD-10-CM | POA: Diagnosis present

## 2024-04-20 DIAGNOSIS — J45909 Unspecified asthma, uncomplicated: Secondary | ICD-10-CM | POA: Diagnosis present

## 2024-04-20 DIAGNOSIS — Z7982 Long term (current) use of aspirin: Secondary | ICD-10-CM

## 2024-04-20 DIAGNOSIS — R7401 Elevation of levels of liver transaminase levels: Secondary | ICD-10-CM

## 2024-04-20 DIAGNOSIS — Z85828 Personal history of other malignant neoplasm of skin: Secondary | ICD-10-CM

## 2024-04-20 LAB — CBC
HCT: 38.9 % (ref 36.0–46.0)
Hemoglobin: 12.8 g/dL (ref 12.0–15.0)
MCH: 29.7 pg (ref 26.0–34.0)
MCHC: 32.9 g/dL (ref 30.0–36.0)
MCV: 90.3 fL (ref 80.0–100.0)
Platelets: 260 K/uL (ref 150–400)
RBC: 4.31 MIL/uL (ref 3.87–5.11)
RDW: 13.6 % (ref 11.5–15.5)
WBC: 9.2 K/uL (ref 4.0–10.5)
nRBC: 0 % (ref 0.0–0.2)

## 2024-04-20 LAB — BASIC METABOLIC PANEL WITH GFR
Anion gap: 12 (ref 5–15)
BUN: 29 mg/dL — ABNORMAL HIGH (ref 6–20)
CO2: 25 mmol/L (ref 22–32)
Calcium: 9.1 mg/dL (ref 8.9–10.3)
Chloride: 103 mmol/L (ref 98–111)
Creatinine, Ser: 1.42 mg/dL — ABNORMAL HIGH (ref 0.44–1.00)
GFR, Estimated: 43 mL/min — ABNORMAL LOW (ref >60)
Glucose, Bld: 115 mg/dL — ABNORMAL HIGH (ref 70–99)
Potassium: 3.1 mmol/L — ABNORMAL LOW (ref 3.5–5.1)
Sodium: 140 mmol/L (ref 135–145)

## 2024-04-20 LAB — URINALYSIS, ROUTINE W REFLEX MICROSCOPIC
Bilirubin Urine: NEGATIVE
Glucose, UA: >=500 mg/dL — AB
Hgb urine dipstick: NEGATIVE
Ketones, ur: NEGATIVE mg/dL
Nitrite: NEGATIVE
Protein, ur: NEGATIVE mg/dL
Specific Gravity, Urine: 1.02 (ref 1.005–1.030)
pH: 6 (ref 5.0–8.0)

## 2024-04-20 LAB — LIPASE, BLOOD: Lipase: 1349 U/L — ABNORMAL HIGH (ref 11–51)

## 2024-04-20 LAB — TROPONIN I (HIGH SENSITIVITY): Troponin I (High Sensitivity): 5 ng/L (ref <18)

## 2024-04-20 MED ORDER — SODIUM CHLORIDE 0.9 % IV BOLUS
1000.0000 mL | Freq: Once | INTRAVENOUS | Status: AC
Start: 2024-04-20 — End: 2024-04-21
  Administered 2024-04-21: 1000 mL via INTRAVENOUS

## 2024-04-20 MED ORDER — IOHEXOL 300 MG/ML  SOLN
80.0000 mL | Freq: Once | INTRAMUSCULAR | Status: AC | PRN
  Administered 2024-04-20: 80 mL via INTRAVENOUS

## 2024-04-20 MED ORDER — MORPHINE SULFATE (PF) 4 MG/ML IV SOLN
4.0000 mg | Freq: Once | INTRAVENOUS | Status: AC
  Administered 2024-04-21: 4 mg via INTRAVENOUS
  Filled 2024-04-20: qty 1

## 2024-04-20 MED ORDER — ONDANSETRON HCL 4 MG/2ML IJ SOLN
4.0000 mg | Freq: Once | INTRAMUSCULAR | Status: AC
  Administered 2024-04-21: 4 mg via INTRAVENOUS
  Filled 2024-04-20: qty 2

## 2024-04-20 NOTE — ED Provider Notes (Signed)
 [facility] Provider Note    Event Date/Time   First MD Initiated Contact with Patient 04/20/24 2337     (approximate)   History   Abdominal Pain   HPI  Haley Brennan is a 58 y.o. female   Past medical history of hypertension hyperlipidemia, no significant alcohol use, no prior history of surgeries, here with acute onset severe epigastric pain.  Occurred approximately 7 PM tonight.  Was feeling well prior, no abdominal pain, no recent illnesses and tolerating p.o. recently with no nausea vomiting or diarrhea.  Feels nauseated now.  No chest pain or shortness of breath.  No other acute medical complaints.  Independent Historian contributed to assessment above: Husband corroborates information above    Physical Exam   Triage Vital Signs: ED Triage Vitals  Encounter Vitals Group     BP 04/20/24 1936 (!) 153/119     Girls Systolic BP Percentile --      Girls Diastolic BP Percentile --      Boys Systolic BP Percentile --      Boys Diastolic BP Percentile --      Pulse Rate 04/20/24 1936 73     Resp 04/20/24 1936 20     Temp 04/20/24 1936 98.1 F (36.7 C)     Temp Source 04/20/24 1936 Oral     SpO2 04/20/24 1936 95 %     Weight 04/20/24 1937 250 lb (113.4 kg)     Height 04/20/24 1937 5' 8 (1.727 m)     Head Circumference --      Peak Flow --      Pain Score 04/20/24 1937 8     Pain Loc --      Pain Education --      Exclude from Growth Chart --     Most recent vital signs: Vitals:   04/21/24 0330 04/21/24 0400  BP: (!) 160/90 (!) 143/84  Pulse: 82 81  Resp: 16   Temp:    SpO2: 99% 99%    General: Awake, no distress.  CV:  Good peripheral perfusion.  Resp:  Normal effort.  Abd:  No distention.  Other:  Epigastric tenderness to palpation and mild in the right and left upper quadrants.  Relatively benign nontender lower quadrant exam, no peritonitis   ED Results / Procedures / Treatments   Labs (all labs ordered are  listed, but only abnormal results are displayed) Labs Reviewed  BASIC METABOLIC PANEL WITH GFR - Abnormal; Notable for the following components:      Result Value   Potassium 3.1 (*)    Glucose, Bld 115 (*)    BUN 29 (*)    Creatinine, Ser 1.42 (*)    GFR, Estimated 43 (*)    All other components within normal limits  LIPASE, BLOOD - Abnormal; Notable for the following components:   Lipase 1,349 (*)    All other components within normal limits  URINALYSIS, ROUTINE W REFLEX MICROSCOPIC - Abnormal; Notable for the following components:   Color, Urine YELLOW (*)    APPearance HAZY (*)    Glucose, UA >=500 (*)    Leukocytes,Ua SMALL (*)    Bacteria, UA RARE (*)    All other components within normal limits  HEPATIC FUNCTION PANEL - Abnormal; Notable for the following components:   AST 406 (*)    ALT 189 (*)    Alkaline Phosphatase 155 (*)    Total Bilirubin 1.4 (*)    Bilirubin, Direct  0.7 (*)    All other components within normal limits  CBC  TROPONIN I (HIGH SENSITIVITY)  TROPONIN I (HIGH SENSITIVITY)     I ordered and reviewed the above labs they are notable for lipase is over 1300, potassium slightly low at 3.1.  Normal initial troponin.  EKG  ED ECG REPORT I, Ginnie Shams, the attending physician, personally viewed and interpreted this ECG.   Date: 04/20/2024  EKG Time: 1940  Rate: 65  Rhythm: sinus  Axis: nl  Intervals:nl  ST&T Change: no stemi    RADIOLOGY I independently reviewed and interpreted chest x-ray and I see no obvious focality or pneumothorax I also reviewed radiologist's formal read.   PROCEDURES:  Critical Care performed: No  Procedures   MEDICATIONS ORDERED IN ED: Medications  morphine (PF) 4 MG/ML injection 4 mg (has no administration in time range)  ondansetron  (ZOFRAN ) injection 4 mg (has no administration in time range)  iohexol (OMNIPAQUE) 300 MG/ML solution 80 mL (80 mLs Intravenous Contrast Given 04/20/24 2348)  morphine (PF) 4  MG/ML injection 4 mg (4 mg Intravenous Given 04/21/24 0001)  sodium chloride 0.9 % bolus 1,000 mL (0 mLs Intravenous Stopped 04/21/24 0330)  ondansetron  (ZOFRAN ) injection 4 mg (4 mg Intravenous Given 04/21/24 0000)  0.9 %  sodium chloride infusion ( Intravenous New Bag/Given 04/21/24 0329)  ketorolac (TORADOL) 15 MG/ML injection 15 mg (15 mg Intravenous Given 04/21/24 0108)    External physician / consultants:  I spoke with hospital medicine for admission and regarding care plan for this patient.   IMPRESSION / MDM / ASSESSMENT AND PLAN / ED COURSE  I reviewed the triage vital signs and the nursing notes.                                Patient's presentation is most consistent with acute presentation with potential threat to life or bodily function.  Differential diagnosis includes, but is not limited to, pancreatitis, biliary colic, cholecystitis, ulcer/GERD/gastritis, ACS, ruptured viscus   MDM:    Sudden onset severe epigastric pain with a lipase over 1300 consistent with acute pancreatitis.  Doubt alcohol-induced is not a drinker.  Consider gallbladder etiology, will add on LFTs, obtain CT scan to look for gallbladder stones, cholecystitis concomitant, or complicated pancreatitis.  Will give IV fluids as well as IV pain control and antiemetic.    Doubt ACS as no chest pain nonischemic EKG and normal initial troponin.  Admission.   -- Pain improved with initial treatment.  Will maintain on saline infusion.  CT unremarkable.  However LFTs are abnormal including alk phos bilirubin AST and ALT.  Makes me concern for biliary obstruction.  Will proceed with MRCP.    -- Patient stable awaiting the final reads from radiology for MRCP.  It has been quite sometime since the study was performed and no read has been posted and thus we have paged out the radiology several times to ask for at least the preliminary read, and they are seeking a radiologist to write a preliminary report.   I was  informed by GI previously that our ERCP physician is on vacation and thus she had the MRCP show any biliary obstructions, she will need a transport to the facility that has ERCP -- awaiting read for disposition admission at [facility] versus facility that can provide ERCP.  MRCP with no biliary obstruction.  Patient stable for admission to hospital service for acute pancreatitis  and transaminitis    FINAL CLINICAL IMPRESSION(S) / ED DIAGNOSES   Final diagnoses:  Acute pancreatitis, unspecified complication status, unspecified pancreatitis type  Transaminitis     Rx / DC Orders   ED Discharge Orders     None        Note:  This document was prepared using Dragon voice recognition software and may include unintentional dictation errors.    Cyrena Mylar, MD 04/21/24 STANLY    Cyrena Mylar, MD 04/21/24 9545    Cyrena Mylar, MD 04/21/24 [phone]

## 2024-04-20 NOTE — ED Triage Notes (Signed)
 Pt ambulatory to triage.  Pt has pain in upper abd pain.  No n/v/d   sx began today   no otc meds.  No back pain  pt alert  pt tearful

## 2024-04-21 ENCOUNTER — Observation Stay

## 2024-04-21 ENCOUNTER — Emergency Department

## 2024-04-21 DIAGNOSIS — K8500 Idiopathic acute pancreatitis without necrosis or infection: Secondary | ICD-10-CM

## 2024-04-21 DIAGNOSIS — K859 Acute pancreatitis without necrosis or infection, unspecified: Secondary | ICD-10-CM | POA: Diagnosis present

## 2024-04-21 LAB — CREATININE, SERUM
Creatinine, Ser: 1.41 mg/dL — ABNORMAL HIGH (ref 0.44–1.00)
GFR, Estimated: 43 mL/min — ABNORMAL LOW (ref >60)

## 2024-04-21 LAB — HEPATIC FUNCTION PANEL
ALT: 189 U/L — ABNORMAL HIGH (ref 0–44)
AST: 406 U/L — ABNORMAL HIGH (ref 15–41)
Albumin: 4.1 g/dL (ref 3.5–5.0)
Alkaline Phosphatase: 155 U/L — ABNORMAL HIGH (ref 38–126)
Bilirubin, Direct: 0.7 mg/dL — ABNORMAL HIGH (ref 0.0–0.2)
Indirect Bilirubin: 0.7 mg/dL (ref 0.3–0.9)
Total Bilirubin: 1.4 mg/dL — ABNORMAL HIGH (ref 0.0–1.2)
Total Protein: 8 g/dL (ref 6.5–8.1)

## 2024-04-21 LAB — CBC
HCT: 38.7 % (ref 36.0–46.0)
Hemoglobin: 12.8 g/dL (ref 12.0–15.0)
MCH: 29.7 pg (ref 26.0–34.0)
MCHC: 33.1 g/dL (ref 30.0–36.0)
MCV: 89.8 fL (ref 80.0–100.0)
Platelets: 238 K/uL (ref 150–400)
RBC: 4.31 MIL/uL (ref 3.87–5.11)
RDW: 13.6 % (ref 11.5–15.5)
WBC: 8.5 K/uL (ref 4.0–10.5)
nRBC: 0 % (ref 0.0–0.2)

## 2024-04-21 LAB — TROPONIN I (HIGH SENSITIVITY): Troponin I (High Sensitivity): 6 ng/L (ref <18)

## 2024-04-21 MED ORDER — ASPIRIN 81 MG PO CHEW
81.0000 mg | CHEWABLE_TABLET | Freq: Every day | ORAL | Status: DC
  Administered 2024-04-21 – 2024-04-26 (×6): 81 mg via ORAL
  Filled 2024-04-21 (×6): qty 1

## 2024-04-21 MED ORDER — TOPIRAMATE 25 MG PO TABS
25.0000 mg | ORAL_TABLET | Freq: Two times a day (BID) | ORAL | Status: DC
  Administered 2024-04-21 – 2024-04-26 (×8): 25 mg via ORAL
  Filled 2024-04-21 (×12): qty 1

## 2024-04-21 MED ORDER — MORPHINE SULFATE (PF) 4 MG/ML IV SOLN
4.0000 mg | INTRAVENOUS | Status: DC | PRN
  Administered 2024-04-21 – 2024-04-22 (×3): 4 mg via INTRAVENOUS
  Filled 2024-04-21 (×3): qty 1

## 2024-04-21 MED ORDER — HYDRALAZINE HCL 20 MG/ML IJ SOLN: 10.0000 mg | Freq: Four times a day (QID) | INTRAMUSCULAR | Status: DC | PRN

## 2024-04-21 MED ORDER — SODIUM CHLORIDE 0.9 % IV SOLN: Freq: Once | INTRAVENOUS | Status: AC

## 2024-04-21 MED ORDER — ENOXAPARIN SODIUM 60 MG/0.6ML IJ SOSY
60.0000 mg | PREFILLED_SYRINGE | INTRAMUSCULAR | Status: DC
  Administered 2024-04-21 – 2024-04-25 (×5): 60 mg via SUBCUTANEOUS
  Filled 2024-04-21 (×6): qty 0.6

## 2024-04-21 MED ORDER — KETOROLAC TROMETHAMINE 15 MG/ML IJ SOLN
15.0000 mg | Freq: Once | INTRAMUSCULAR | Status: AC
  Administered 2024-04-21: 15 mg via INTRAVENOUS
  Filled 2024-04-21: qty 1

## 2024-04-21 MED ORDER — ACETAMINOPHEN 650 MG RE SUPP: 650.0000 mg | Freq: Four times a day (QID) | RECTAL | Status: DC | PRN

## 2024-04-21 MED ORDER — PANTOPRAZOLE SODIUM 40 MG IV SOLR
40.0000 mg | Freq: Two times a day (BID) | INTRAVENOUS | Status: DC
  Administered 2024-04-21 – 2024-04-23 (×5): 40 mg via INTRAVENOUS
  Filled 2024-04-21 (×5): qty 10

## 2024-04-21 MED ORDER — POTASSIUM CHLORIDE IN NACL 20-0.9 MEQ/L-% IV SOLN
INTRAVENOUS | Status: AC
Start: 2024-04-21 — End: 2024-04-22
  Filled 2024-04-21 (×3): qty 1000

## 2024-04-21 MED ORDER — ACETAMINOPHEN 325 MG PO TABS: 650.0000 mg | ORAL_TABLET | Freq: Four times a day (QID) | ORAL | Status: DC | PRN

## 2024-04-21 MED ORDER — TIZANIDINE HCL 2 MG PO TABS
4.0000 mg | ORAL_TABLET | Freq: Every day | ORAL | Status: DC
  Administered 2024-04-21 – 2024-04-25 (×5): 4 mg via ORAL
  Filled 2024-04-21 (×5): qty 2

## 2024-04-21 MED ORDER — CITALOPRAM HYDROBROMIDE 20 MG PO TABS
20.0000 mg | ORAL_TABLET | Freq: Every day | ORAL | Status: DC
  Administered 2024-04-21 – 2024-04-26 (×6): 20 mg via ORAL
  Filled 2024-04-21 (×6): qty 1

## 2024-04-21 MED ORDER — ONDANSETRON HCL 4 MG/2ML IJ SOLN
4.0000 mg | Freq: Four times a day (QID) | INTRAMUSCULAR | Status: DC | PRN
  Administered 2024-04-21 – 2024-04-24 (×5): 4 mg via INTRAVENOUS
  Filled 2024-04-21 (×6): qty 2

## 2024-04-21 MED ORDER — OXYCODONE HCL 5 MG PO TABS
5.0000 mg | ORAL_TABLET | ORAL | Status: DC | PRN
  Administered 2024-04-21: 5 mg via ORAL
  Filled 2024-04-21 (×2): qty 1

## 2024-04-21 MED ORDER — SODIUM CHLORIDE 0.9 % IV SOLN
12.5000 mg | Freq: Four times a day (QID) | INTRAVENOUS | Status: DC | PRN
  Administered 2024-04-23 – 2024-04-24 (×2): 12.5 mg via INTRAVENOUS
  Filled 2024-04-21 (×2): qty 12.5

## 2024-04-21 MED ORDER — METOPROLOL SUCCINATE ER 50 MG PO TB24
200.0000 mg | ORAL_TABLET | Freq: Every day | ORAL | Status: DC
  Administered 2024-04-21 – 2024-04-24 (×4): 200 mg via ORAL
  Filled 2024-04-21 (×4): qty 4

## 2024-04-21 NOTE — Progress Notes (Signed)
 Anticoagulation monitoring(Lovenox):  58yo  F ordered Lovenox 40 mg Q24h    Filed Weights   04/20/24 1937  Weight: 113.4 kg (250 lb)   BMI 38   Lab Results  Component Value Date   CREATININE 1.42 (H) 04/20/2024   Estimated Creatinine Clearance: 56.4 mL/min (A) (by C-G formula based on SCr of 1.42 mg/dL (H)). Hemoglobin & Hematocrit     Component Value Date/Time   HGB 12.8 04/21/2024 0912   HCT 38.7 04/21/2024 0912     Per Protocol for Patient with estCrcl > 30 ml/min and BMI > 30, will transition to Lovenox 0.5 mg/kg Q24h       Allean Haas PharmD Clinical Pharmacist 04/21/2024

## 2024-04-21 NOTE — Progress Notes (Signed)
 Brief Assessment Note  Medical record reviewed and patient has no TOC needs at this time. Please outreach to [facility] if needs are identified.

## 2024-04-21 NOTE — H&P (Signed)
 History and Physical    Haley Brennan FMW:[id number] DOB: 04-14-1966 DOA: 04/20/2024  DOS: the patient was seen and examined on 04/20/2024  PCP: Lenon Layman ORN, MD   Patient coming from: Home  I have personally briefly reviewed patient's old medical records in [facility]  Chief Complaint: Abdominal pain and vomiting started 7 PM last night  HPI: Haley Brennan is a pleasant 58 y.o. female with medical history significant for morbid obesity, HTN, HLD, no significant alcohol use, no prior history of surgeries who came into ED at [facility] complaining of sudden onset of severe epigastric pain started 7 PM last night.  Patient stated that her pain was 7/10 in intensity started while driving, squeezing, constant positive with some nausea and multiple episodes of vomiting.  She denies any fever, chills, chest pain, palpitations, hematemesis, melena or diarrhea.  ED Course: Upon arrival to the ED, patient is found to be in severe pain, potassium 3.1, lipase 1349, AST 406, ALT 189, alkaline phos 155.  MRCP of the abdomen showed acute pancreatitis without any choledocholithiasis.  Patient received morphine, ondansetron , IV fluid, ketorolac.  Hospitalist service was consulted for evaluation for admission for acute pancreatitis.  Review of Systems:  ROS  All other systems negative except as noted in the HPI.  Past Medical History:  Diagnosis Date   Hyperlipemia    Hypertension    Joint pain    Melanoma (HCC) 1997    Past Surgical History:  Procedure Laterality Date   Achiles tendon     bone spur Right    heel   DILATION AND CURETTAGE OF UTERUS  2013   with ablation   SKIN CANCER EXCISION Left 1997   melonoma /upper thigh x2   SKIN LESION EXCISION Left ?   elbow/pre cancerous per pt     reports that she has never smoked. She has never used smokeless tobacco. She reports current alcohol use. She reports that she does not use drugs.  No Known Allergies  Family History   Problem Relation Age of Onset   Seizures Father    Cancer Father        Brain   Breast cancer Maternal Aunt 50    Prior to Admission medications   Medication Sig Start Date End Date Taking? Authorizing Provider  aspirin 81 MG chewable tablet Chew by mouth.    [provider]  Cholecalciferol (VITAMIN D3) 2000 UNITS capsule Take by mouth.    [provider]  citalopram (CELEXA) 20 MG tablet Take by mouth.    [provider]  JARDIANCE 10 MG TABS tablet Take 10 mg by mouth daily.    [provider]  metoprolol (TOPROL-XL) 200 MG 24 hr tablet Take 200 mg by mouth daily.  12/28/13   [provider]  olmesartan-hydrochlorothiazide (BENICAR HCT) 40-25 MG tablet  10/29/18   [provider]  phentermine (ADIPEX-P) 37.5 MG tablet Take by mouth. 02/21/19   [provider]  Pyridoxine HCl (VITAMIN B-6 PO) Take by mouth daily.    [provider]  rosuvastatin (CRESTOR) 40 MG tablet Take 40 mg by mouth daily.    [provider]  tiZANidine (ZANAFLEX) 4 MG tablet Take 1 tablet every day by oral route at bedtime. 04/26/21   [provider]  topiramate (TOPAMAX) 25 MG tablet Take 1 tablet by mouth 2 (two) times daily. 08/20/21   [provider]  traZODone (DESYREL) 50 MG tablet  11/25/18   [provider]  Physical Exam: Vitals:   04/21/24 0600 04/21/24 0700 04/21/24 0732 04/21/24 0900  BP: (!) 140/71 (!) 148/74  (!) 145/74  Pulse: 78 69  69  Resp:    14  Temp:   98.6 F (37 C) 99.1 F (37.3 C)  TempSrc:   Oral Oral  SpO2: 99% 100%  100%  Weight:      Height:        Physical Exam   Constitutional: Alert, awake, calm, comfortable HEENT: Neck supple Respiratory: Clear to auscultation B/L, no wheezing, no rales.  Cardiovascular: Regular rate and rhythm, no murmurs / rubs / gallops. No extremity edema. 2+ pedal pulses. No carotid bruits.  Abdomen: Soft, moderate tenderness around  epigastric area, no rebound, bowel sounds are present, morbidly obese abdomen Musculoskeletal: no clubbing / cyanosis. Good ROM, no contractures. Normal muscle tone.  Skin: no rashes, lesions, ulcers. Neurologic: CN 2-12 grossly intact. Sensation intact, No focal deficit identified Psychiatric: Alert and oriented x 3. Normal mood.    Labs on Admission: I have personally reviewed following labs and imaging studies  CBC: Recent Labs  Lab 04/20/24 1939  WBC 9.2  HGB 12.8  HCT 38.9  MCV 90.3  PLT 260   Basic Metabolic Panel: Recent Labs  Lab 04/20/24 1939  NA 140  K 3.1*  CL 103  CO2 25  GLUCOSE 115*  BUN 29*  CREATININE 1.42*  CALCIUM 9.1   GFR: Estimated Creatinine Clearance: 56.4 mL/min (A) (by C-G formula based on SCr of 1.42 mg/dL (H)). Liver Function Tests: Recent Labs  Lab 04/20/24 2337  AST 406*  ALT 189*  ALKPHOS 155*  BILITOT 1.4*  PROT 8.0  ALBUMIN 4.1   Recent Labs  Lab 04/20/24 1939  LIPASE 1,349*   No results for input(s): AMMONIA in the last 168 hours. Coagulation Profile: No results for input(s): INR, PROTIME in the last 168 hours. Cardiac Enzymes: Recent Labs  Lab 04/20/24 1939 04/20/24 2337  TROPONINIHS 5 6   BNP (last 3 results) No results for input(s): BNP in the last 8760 hours. HbA1C: No results for input(s): HGBA1C in the last 72 hours. CBG: No results for input(s): GLUCAP in the last 168 hours. Lipid Profile: No results for input(s): CHOL, HDL, LDLCALC, TRIG, CHOLHDL, LDLDIRECT in the last 72 hours. Thyroid Function Tests: No results for input(s): TSH, T4TOTAL, FREET4, T3FREE, THYROIDAB in the last 72 hours. Anemia Panel: No results for input(s): VITAMINB12, FOLATE, FERRITIN, TIBC, IRON, RETICCTPCT in the last 72 hours. Urine analysis:    Component Value Date/Time   COLORURINE YELLOW (A) 04/20/2024 1945   APPEARANCEUR HAZY (A) 04/20/2024 1945   APPEARANCEUR Clear 09/13/2020  1302   LABSPEC 1.020 04/20/2024 1945   PHURINE 6.0 04/20/2024 1945   GLUCOSEU >=500 (A) 04/20/2024 1945   HGBUR NEGATIVE 04/20/2024 1945   BILIRUBINUR NEGATIVE 04/20/2024 1945   BILIRUBINUR Negative 09/13/2020 1302   KETONESUR NEGATIVE 04/20/2024 1945   PROTEINUR NEGATIVE 04/20/2024 1945   NITRITE NEGATIVE 04/20/2024 1945   LEUKOCYTESUR SMALL (A) 04/20/2024 1945    Radiological Exams on Admission: I have personally reviewed images MR ABDOMEN MRCP WO CONTRAST Result Date: 04/21/2024 CLINICAL DATA:  RUQ abdominal pain, biliary disease suspected, no prior imaging EXAM: MRI ABDOMEN WITHOUT CONTRAST  (INCLUDING MRCP) TECHNIQUE: Multiplanar multisequence MR imaging of the abdomen was performed. Heavily T2-weighted images of the biliary and pancreatic ducts were obtained, and three-dimensional MRCP images were rendered by post processing. COMPARISON:  CT 04/20/2024 FINDINGS: Lower chest: No acute findings.  Small hiatal hernia Hepatobiliary: The liver demonstrates normal parenchymal signal characteristics no focal intrahepatic mass identified. There is periportal edema present. No intra or extrahepatic biliary ductal dilation. No intraluminal filling defect within the extrahepatic bile duct to suggest choledocholithiasis. Pancreas: There is interstitial edema and peripancreatic edema diffusely involving the pancreas in keeping with changes of acute interstitial/edematous pancreatitis. No definite evidence of parenchymal necrosis on this limited noncontrast examination. No loculated peripancreatic fluid collections are identified. The pancreatic duct is dilated. Spleen:  Within normal limits in size and appearance. Adrenals/Urinary Tract: The adrenal glands are unremarkable. The kidneys are normal in size and position. Simple parapelvic cysts are seen within the kidneys bilaterally for which no follow-up imaging is recommended. Air no intrarenal masses. No hydronephrosis. Stomach/Bowel: Moderate duodenal  diverticulum is identified in the region of the ampulla. Extensive diverticular mural and peri-diverticular edema is noted and may relate to the adjacent inflammatory process involving the pancreas though a secondary duodenal diverticulitis could appear similarly. Extensive sigmoid diverticulosis incidentally noted. Visualized stomach, large and small bowel are otherwise unremarkable. Vascular/Lymphatic: Preserved flow voids within the portal vein, splenic vein, and superior mesenteric vein. The abdominal vasculature is unremarkable. Other:  None. Musculoskeletal: No suspicious bone lesions identified. IMPRESSION: 1. Acute interstitial/edematous pancreatitis. No definite evidence of parenchymal necrosis on this limited noncontrast examination. No loculated peripancreatic fluid collections are identified. 2. No intra or extrahepatic biliary ductal dilation. No intraluminal filling defect within the extrahepatic bile duct to suggest choledocholithiasis. 3. 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 Extensive duodenal diverticular mural and peri-diverticular edema is noted and may relate to the adjacent inflammatory process involving the pancreas though a secondary duodenal diverticulitis could appear similarly. 4. Extensive sigmoid diverticulosis. 5. Small hiatal hernia. Electronically Signed   By: Dorethia Molt M.D.   On: 04/21/2024 04:58   CT ABDOMEN PELVIS W CONTRAST Result Date: 04/21/2024 CLINICAL DATA:  Upper abdominal pain with elevated lipase, initial encounter EXAM: CT ABDOMEN AND PELVIS WITH CONTRAST TECHNIQUE: Multidetector CT imaging of the abdomen and pelvis was performed using the standard protocol following bolus administration of intravenous contrast. RADIATION DOSE REDUCTION: This exam was performed according to the departmental dose-optimization program which includes automated exposure control, adjustment of the mA and/or kV according to patient size and/or use of iterative  reconstruction technique. CONTRAST:  80mL OMNIPAQUE IOHEXOL 300 MG/ML  SOLN COMPARISON:  None Available. FINDINGS: Lower chest: No acute abnormality. Hepatobiliary: Gallbladder is well distended although no cholelithiasis is seen. Liver is unremarkable. Pancreas: Unremarkable. No pancreatic ductal dilatation or surrounding inflammatory changes. Prominent duodenal diverticulum is noted adjacent to the head of the pancreas. Spleen: Normal in size without focal abnormality. Adrenals/Urinary Tract: Adrenal glands are within normal limits. Kidneys demonstrate a normal enhancement pattern bilaterally. No renal calculi are noted. Peripelvic cysts are seen bilaterally slightly worse on the left than the right. No follow-up is recommended. The bladder is decompressed. Stomach/Bowel: Scattered diverticular change of the colon is noted without evidence of diverticulitis. No obstructive changes are seen. The appendix is not well visualized. No inflammatory changes are seen. Stomach is decompressed. Small bowel demonstrates a prominent duodenal diverticulum adjacent to the head of the pancreas. Vascular/Lymphatic: Aortic atherosclerosis. No enlarged abdominal or pelvic lymph nodes. Reproductive: Uterus and bilateral adnexa are unremarkable. Other: No abdominal wall hernia or abnormality. No abdominopelvic ascites. Musculoskeletal: No acute or significant osseous findings. IMPRESSION: Diverticulosis without diverticulitis. Prominent duodenal diverticulum without complicating factors. Electronically Signed   By: Oneil Evelyne HERO.D.  On: 04/21/2024 00:08   DG Chest 2 View Result Date: 04/20/2024 CLINICAL DATA:  Abdominal pain. Upper abdominal pain. Symptoms began today. EXAM: CHEST - 2 VIEW COMPARISON:  04/24/2009 FINDINGS: Normal heart size and pulmonary vascularity. No focal airspace disease or consolidation in the lungs. No blunting of costophrenic angles. No pneumothorax. Mediastinal contours appear intact. Degenerative  changes in the spine. IMPRESSION: No active cardiopulmonary disease. Electronically Signed   By: Elsie Gravely M.D.   On: 04/20/2024 20:09    EKG: My personal interpretation of EKG shows: Sinus rhythm    Assessment/Plan Principal Problem:   Acute pancreatitis Active Problems:   Asthma without status asthmaticus   BMI 40.0-44.9, adult (HCC)   HTN (hypertension), benign   Hyperlipidemia    Assessment and Plan:  58 year old morbidly obese female with history of HTN, HLD, asthma who came in to ED for epigastric pain due to acute pancreatitis.  1.  Acute idiopathic pancreatitis - It is not clear whether she had gallstone that she might have passed or any other etiology.  She takes hydrochlorothiazide and also she drinks alcohol very occasionally and last drink was 1 month ago.  It will be difficult to find out what might have caused the problem.  Most possible explanation could be she might have passed stone as she has a very risk factors to have gallstone given abnormal liver enzymes.  She also has hydrochlorothiazide which might have caused the problem as well.  We need to evaluate those possibilities. - She will be admitted to hospital as inpatient. - She will be n.p.o., IV fluid, pain medications.  After 24 hours of bowel rest then we can reintroduced food in the form of clear liquid diet and advance as tolerated. - Will get lipid panel and ultrasound of the gallbladder just to make sure she does not have remaining gallstones. - Will give her Protonix to decrease the acid  2.  HTN - Blood pressure appears to be fairly controlled - Will discontinue hydrochlorothiazide combination - Will place her on hydralazine as needed for systolic blood pressure more than 160 - Continue to monitor blood pressure  3.  HLD - Will hold off for statin while she has abnormal liver enzymes  4.  Asthma - DuoNebs as needed  5.  AKI - She will be on IV fluid - Will continue to monitor kidney  function  6.  Hypokalemia - Will replace potassium and check potassium level    DVT prophylaxis: Lovenox Code Status: Full Code Family Communication: Husband at bedside Disposition Plan: Home Consults called: None Admission status: Inpatient, Med-Surg   Nena Rebel, MD [facility] 04/21/2024, 9:14 AM

## 2024-04-21 NOTE — Plan of Care (Signed)

## 2024-04-22 DIAGNOSIS — K859 Acute pancreatitis without necrosis or infection, unspecified: Secondary | ICD-10-CM | POA: Diagnosis not present

## 2024-04-22 LAB — COMPREHENSIVE METABOLIC PANEL WITH GFR
ALT: 246 U/L — ABNORMAL HIGH (ref 0–44)
AST: 118 U/L — ABNORMAL HIGH (ref 15–41)
Albumin: 3.4 g/dL — ABNORMAL LOW (ref 3.5–5.0)
Alkaline Phosphatase: 149 U/L — ABNORMAL HIGH (ref 38–126)
Anion gap: 9 (ref 5–15)
BUN: 29 mg/dL — ABNORMAL HIGH (ref 6–20)
CO2: 23 mmol/L (ref 22–32)
Calcium: 8.3 mg/dL — ABNORMAL LOW (ref 8.9–10.3)
Chloride: 110 mmol/L (ref 98–111)
Creatinine, Ser: 1.27 mg/dL — ABNORMAL HIGH (ref 0.44–1.00)
GFR, Estimated: 49 mL/min — ABNORMAL LOW (ref >60)
Glucose, Bld: 115 mg/dL — ABNORMAL HIGH (ref 70–99)
Potassium: 3.6 mmol/L (ref 3.5–5.1)
Sodium: 142 mmol/L (ref 135–145)
Total Bilirubin: 0.9 mg/dL (ref 0.0–1.2)
Total Protein: 7.1 g/dL (ref 6.5–8.1)

## 2024-04-22 LAB — CBC
HCT: 41.6 % (ref 36.0–46.0)
Hemoglobin: 13.4 g/dL (ref 12.0–15.0)
MCH: 29.7 pg (ref 26.0–34.0)
MCHC: 32.2 g/dL (ref 30.0–36.0)
MCV: 92.2 fL (ref 80.0–100.0)
Platelets: 248 K/uL (ref 150–400)
RBC: 4.51 MIL/uL (ref 3.87–5.11)
RDW: 13.8 % (ref 11.5–15.5)
WBC: 10.4 K/uL (ref 4.0–10.5)
nRBC: 0 % (ref 0.0–0.2)

## 2024-04-22 LAB — MISC LABCORP TEST (SEND OUT): Labcorp test code: 83935

## 2024-04-22 LAB — LIPASE, BLOOD: Lipase: 51 U/L (ref 11–51)

## 2024-04-22 LAB — PROTIME-INR
INR: 1.1 (ref 0.8–1.2)
Prothrombin Time: 14.9 s (ref 11.4–15.2)

## 2024-04-22 LAB — MAGNESIUM: Magnesium: 2.1 mg/dL (ref 1.7–2.4)

## 2024-04-22 MED ORDER — DOCUSATE SODIUM 100 MG PO CAPS
100.0000 mg | ORAL_CAPSULE | Freq: Two times a day (BID) | ORAL | Status: DC | PRN
  Administered 2024-04-22 – 2024-04-24 (×2): 100 mg via ORAL
  Filled 2024-04-22 (×2): qty 1

## 2024-04-22 MED ORDER — SODIUM CHLORIDE 0.9 % IV SOLN: INTRAVENOUS | Status: AC

## 2024-04-22 MED ORDER — MORPHINE SULFATE (PF) 2 MG/ML IV SOLN
2.0000 mg | INTRAVENOUS | Status: DC | PRN
  Administered 2024-04-22 (×2): 2 mg via INTRAVENOUS
  Filled 2024-04-22 (×2): qty 1

## 2024-04-22 MED ORDER — OXYCODONE HCL 5 MG PO TABS
5.0000 mg | ORAL_TABLET | ORAL | Status: DC | PRN
  Filled 2024-04-22: qty 1

## 2024-04-22 NOTE — Plan of Care (Signed)

## 2024-04-22 NOTE — Progress Notes (Signed)
  PROGRESS NOTE    Haley Brennan  FMW:[id number] DOB: July 29, 1965 DOA: 04/20/2024 PCP: Lenon Layman ORN, MD  106A/106A-AA  LOS: 0 days   Brief hospital course:   Assessment & Plan: Haley Brennan is a pleasant 58 y.o. female with medical history significant for morbid obesity, HTN, HLD, no significant alcohol use, no prior history of surgeries who came into ED at [facility] complaining of sudden onset of severe epigastric pain started 7 PM last night.    1.  Acute idiopathic pancreatitis - It is not clear whether she had gallstone that she might have passed or any other etiology.  She takes hydrochlorothiazide and also she drinks alcohol very occasionally and last drink was 1 month ago.  It will be difficult to find out what might have caused the problem.  possible explanation could be she might have passed stone as she has a very risk factors to have gallstone given abnormal liver enzymes.   --cont MIVF --clear liquid diet --triglyceride level   2.  HTN --cont Toprol   3.  HLD --hold statin   5.  AKI --cont MIVF   6.  Hypokalemia --monitor and supplement PRN  Elevated LFT's --trend   DVT prophylaxis: Lovenox SQ Code Status: Full code  Family Communication: husband updated at bedside today Level of care: Med-Surg Dispo:   The patient is from: home Anticipated d/c is to: home Anticipated d/c date is: 1-2 days   Subjective and Interval History:  Pt reported no N/V today.  Abdominal pain had moved downwards.   Objective: Vitals:   04/22/24 0447 04/22/24 0843 04/22/24 1554 04/22/24 1938  BP: 112/70 134/72 (!) 144/67 129/69  Pulse: 65 71 69 71  Resp: 16 14 20    Temp: 98 F (36.7 C) 98 F (36.7 C) 97.8 F (36.6 C) 98.9 F (37.2 C)  TempSrc:      SpO2: 91% 96% 93% 95%  Weight:      Height:        Intake/Output Summary (Last 24 hours) at 04/22/2024 1939 Last data filed at 04/22/2024 1704 Gross per 24 hour  Intake 1161.95 ml  Output --  Net 1161.95  ml   Filed Weights   04/20/24 1937  Weight: 113.4 kg    Examination:   Constitutional: NAD, AAOx3 HEENT: conjunctivae and lids normal, EOMI CV: No cyanosis.   RESP: normal respiratory effort, on RA Neuro: II - XII grossly intact.   Psych: Normal mood and affect.  Appropriate judgement and reason   Data Reviewed: I have personally reviewed labs and imaging studies  Time spent: 50 minutes  Ellouise Haber, MD [facility] If 7PM-7AM, please contact night-coverage 04/22/2024, 7:39 PM

## 2024-04-23 DIAGNOSIS — Z9889 Other specified postprocedural states: Secondary | ICD-10-CM | POA: Diagnosis not present

## 2024-04-23 DIAGNOSIS — K859 Acute pancreatitis without necrosis or infection, unspecified: Secondary | ICD-10-CM | POA: Diagnosis present

## 2024-04-23 DIAGNOSIS — I129 Hypertensive chronic kidney disease with stage 1 through stage 4 chronic kidney disease, or unspecified chronic kidney disease: Secondary | ICD-10-CM | POA: Diagnosis present

## 2024-04-23 DIAGNOSIS — E785 Hyperlipidemia, unspecified: Secondary | ICD-10-CM | POA: Diagnosis present

## 2024-04-23 DIAGNOSIS — E876 Hypokalemia: Secondary | ICD-10-CM | POA: Diagnosis present

## 2024-04-23 DIAGNOSIS — Z820 Family history of epilepsy and other diseases of the nervous system: Secondary | ICD-10-CM | POA: Diagnosis not present

## 2024-04-23 DIAGNOSIS — Z85820 Personal history of malignant melanoma of skin: Secondary | ICD-10-CM | POA: Diagnosis not present

## 2024-04-23 DIAGNOSIS — Z6838 Body mass index (BMI) 38.0-38.9, adult: Secondary | ICD-10-CM | POA: Diagnosis not present

## 2024-04-23 DIAGNOSIS — J45909 Unspecified asthma, uncomplicated: Secondary | ICD-10-CM | POA: Diagnosis present

## 2024-04-23 DIAGNOSIS — K56600 Partial intestinal obstruction, unspecified as to cause: Secondary | ICD-10-CM | POA: Diagnosis not present

## 2024-04-23 DIAGNOSIS — N1831 Chronic kidney disease, stage 3a: Secondary | ICD-10-CM | POA: Diagnosis present

## 2024-04-23 DIAGNOSIS — Z79899 Other long term (current) drug therapy: Secondary | ICD-10-CM | POA: Diagnosis not present

## 2024-04-23 DIAGNOSIS — K8500 Idiopathic acute pancreatitis without necrosis or infection: Secondary | ICD-10-CM | POA: Diagnosis present

## 2024-04-23 DIAGNOSIS — Z7984 Long term (current) use of oral hypoglycemic drugs: Secondary | ICD-10-CM | POA: Diagnosis not present

## 2024-04-23 DIAGNOSIS — K567 Ileus, unspecified: Secondary | ICD-10-CM | POA: Diagnosis not present

## 2024-04-23 DIAGNOSIS — Z803 Family history of malignant neoplasm of breast: Secondary | ICD-10-CM | POA: Diagnosis not present

## 2024-04-23 DIAGNOSIS — Z85828 Personal history of other malignant neoplasm of skin: Secondary | ICD-10-CM | POA: Diagnosis not present

## 2024-04-23 DIAGNOSIS — K5712 Diverticulitis of small intestine without perforation or abscess without bleeding: Secondary | ICD-10-CM | POA: Diagnosis present

## 2024-04-23 DIAGNOSIS — Z7982 Long term (current) use of aspirin: Secondary | ICD-10-CM | POA: Diagnosis not present

## 2024-04-23 LAB — COMPREHENSIVE METABOLIC PANEL WITH GFR
ALT: 143 U/L — ABNORMAL HIGH (ref 0–44)
AST: 36 U/L (ref 15–41)
Albumin: 3.2 g/dL — ABNORMAL LOW (ref 3.5–5.0)
Alkaline Phosphatase: 113 U/L (ref 38–126)
Anion gap: 9 (ref 5–15)
BUN: 26 mg/dL — ABNORMAL HIGH (ref 6–20)
CO2: 24 mmol/L (ref 22–32)
Calcium: 8.3 mg/dL — ABNORMAL LOW (ref 8.9–10.3)
Chloride: 108 mmol/L (ref 98–111)
Creatinine, Ser: 1.12 mg/dL — ABNORMAL HIGH (ref 0.44–1.00)
GFR, Estimated: 57 mL/min — ABNORMAL LOW (ref >60)
Glucose, Bld: 120 mg/dL — ABNORMAL HIGH (ref 70–99)
Potassium: 3.4 mmol/L — ABNORMAL LOW (ref 3.5–5.1)
Sodium: 141 mmol/L (ref 135–145)
Total Bilirubin: 0.8 mg/dL (ref 0.0–1.2)
Total Protein: 6.8 g/dL (ref 6.5–8.1)

## 2024-04-23 LAB — MAGNESIUM: Magnesium: 2.1 mg/dL (ref 1.7–2.4)

## 2024-04-23 LAB — TRIGLYCERIDES: Triglycerides: 115 mg/dL (ref <150)

## 2024-04-23 MED ORDER — POLYETHYLENE GLYCOL 3350 17 G PO PACK
34.0000 g | PACK | Freq: Three times a day (TID) | ORAL | Status: DC
  Administered 2024-04-23 (×2): 34 g via ORAL
  Filled 2024-04-23 (×3): qty 2

## 2024-04-23 MED ORDER — POTASSIUM CHLORIDE CRYS ER 20 MEQ PO TBCR
40.0000 meq | EXTENDED_RELEASE_TABLET | Freq: Once | ORAL | Status: AC
  Administered 2024-04-23: 40 meq via ORAL
  Filled 2024-04-23: qty 2

## 2024-04-23 MED ORDER — SODIUM CHLORIDE 0.9 % IV SOLN: INTRAVENOUS | Status: AC

## 2024-04-23 NOTE — Plan of Care (Signed)

## 2024-04-23 NOTE — Plan of Care (Signed)
  Problem: Education: Goal: Knowledge of General Education information will improve Description: Including pain rating scale, medication(s)/side effects and non-pharmacologic comfort measures Outcome: Progressing   Problem: Health Behavior/Discharge Planning: Goal: Ability to manage health-related needs will improve Outcome: Progressing   Problem: Clinical Measurements: Goal: Ability to maintain clinical measurements within normal limits will improve Outcome: Progressing Goal: Will remain free from infection Outcome: Progressing Goal: Diagnostic test results will improve Outcome: Progressing Goal: Respiratory complications will improve Outcome: Progressing Goal: Cardiovascular complication will be avoided Outcome: Progressing   Problem: Nutrition: Goal: Adequate nutrition will be maintained Outcome: Progressing   Problem: Coping: Goal: Level of anxiety will decrease Outcome: Progressing   Problem: Pain Managment: Goal: General experience of comfort will improve and/or be controlled Outcome: Progressing   Problem: Safety: Goal: Ability to remain free from injury will improve Outcome: Progressing   Problem: Safety: Goal: Ability to remain free from injury will improve Outcome: Progressing

## 2024-04-23 NOTE — Progress Notes (Signed)
  PROGRESS NOTE    Haley Brennan  FMW:[id number] DOB: 1966/04/22 DOA: 04/20/2024 PCP: Lenon Layman ORN, MD  106A/106A-AA  LOS: 0 days   Brief hospital course:   Assessment & Plan: Haley Brennan is a pleasant 58 y.o. female with medical history significant for morbid obesity, HTN, HLD, no significant alcohol use, no prior history of surgeries who came into ED at [facility] complaining of sudden onset of severe epigastric pain started 7 PM last night.    1.  Acute idiopathic pancreatitis - It is not clear whether she had gallstone that she might have passed or any other etiology.  She takes hydrochlorothiazide and also she drinks alcohol very occasionally and last drink was 1 month ago.  It will be difficult to find out what might have caused the problem.  possible explanation could be she might have passed stone as she has a very risk factors to have gallstone given abnormal liver enzymes.   --cont MIVF --trial low-fat diet   2.  HTN --cont Toprol   3.  HLD --hold statin   5.  AKI --cont MIVF   6.  Hypokalemia --supplement PRN  Elevated LFT's --trending down   DVT prophylaxis: Lovenox SQ Code Status: Full code  Family Communication:  Level of care: Med-Surg Dispo:   The patient is from: home Anticipated d/c is to: home Anticipated d/c date is: 1-2 days   Subjective and Interval History:  Pt reported pain improved, however, not yet tolerating diet advancement.   Objective: Vitals:   04/22/24 1938 04/23/24 0412 04/23/24 0730 04/23/24 1508  BP: 129/69 (!) 141/69 (!) 158/72 (!) 157/84  Pulse: 71 64 71 69  Resp:   16 18  Temp: 98.9 F (37.2 C) 98.6 F (37 C) 99.1 F (37.3 C) 98.9 F (37.2 C)  TempSrc:  Oral  Oral  SpO2: 95% 91% 96% 96%  Weight:      Height:        Intake/Output Summary (Last 24 hours) at 04/23/2024 1941 Last data filed at 04/23/2024 0900 Gross per 24 hour  Intake 974.06 ml  Output --  Net 974.06 ml   Filed Weights   04/20/24  1937  Weight: 113.4 kg    Examination:   Constitutional: NAD, AAOx3 HEENT: conjunctivae and lids normal, EOMI CV: No cyanosis.   RESP: normal respiratory effort, on RA Neuro: II - XII grossly intact.   Psych: Normal mood and affect.  Appropriate judgement and reason   Data Reviewed: I have personally reviewed labs and imaging studies  Time spent: 35 minutes  Ellouise Haber, MD [facility] If 7PM-7AM, please contact night-coverage 04/23/2024, 7:41 PM

## 2024-04-24 ENCOUNTER — Inpatient Hospital Stay

## 2024-04-24 DIAGNOSIS — K859 Acute pancreatitis without necrosis or infection, unspecified: Secondary | ICD-10-CM | POA: Diagnosis not present

## 2024-04-24 MED ORDER — SODIUM CHLORIDE 0.9 % IV SOLN
INTRAVENOUS | Status: AC
Start: 2024-04-24 — End: 2024-04-25

## 2024-04-24 MED ORDER — METRONIDAZOLE 500 MG/100ML IV SOLN
500.0000 mg | Freq: Two times a day (BID) | INTRAVENOUS | Status: DC
  Administered 2024-04-24 – 2024-04-26 (×4): 500 mg via INTRAVENOUS
  Filled 2024-04-24 (×5): qty 100

## 2024-04-24 MED ORDER — SMOG ENEMA
960.0000 mL | Freq: Once | RECTAL | Status: AC
  Administered 2024-04-24: 960 mL via RECTAL
  Filled 2024-04-24: qty 240

## 2024-04-24 MED ORDER — POTASSIUM CHLORIDE 20 MEQ PO PACK
40.0000 meq | PACK | Freq: Once | ORAL | Status: AC
Start: 2024-04-24 — End: 2024-04-24
  Administered 2024-04-24: 40 meq via ORAL
  Filled 2024-04-24: qty 2

## 2024-04-24 MED ORDER — SODIUM CHLORIDE 0.9 % IV SOLN
2.0000 g | INTRAVENOUS | Status: DC
  Administered 2024-04-24 – 2024-04-26 (×3): 2 g via INTRAVENOUS
  Filled 2024-04-24 (×3): qty 20

## 2024-04-24 MED ORDER — HYDRALAZINE HCL 20 MG/ML IJ SOLN
5.0000 mg | Freq: Four times a day (QID) | INTRAMUSCULAR | Status: DC
  Administered 2024-04-24 – 2024-04-25 (×2): 5 mg via INTRAVENOUS
  Filled 2024-04-24 (×2): qty 1

## 2024-04-24 NOTE — Plan of Care (Signed)
  Problem: Education: Goal: Knowledge of General Education information will improve Description: Including pain rating scale, medication(s)/side effects and non-pharmacologic comfort measures Outcome: Progressing   Problem: Health Behavior/Discharge Planning: Goal: Ability to manage health-related needs will improve Outcome: Progressing   Problem: Clinical Measurements: Goal: Ability to maintain clinical measurements within normal limits will improve Outcome: Progressing Goal: Will remain free from infection Outcome: Progressing Goal: Diagnostic test results will improve Outcome: Progressing Goal: Respiratory complications will improve Outcome: Progressing Goal: Cardiovascular complication will be avoided Outcome: Progressing   Problem: Nutrition: Goal: Adequate nutrition will be maintained Outcome: Progressing   Problem: Coping: Goal: Level of anxiety will decrease Outcome: Progressing   Problem: Safety: Goal: Ability to remain free from injury will improve Outcome: Progressing   

## 2024-04-24 NOTE — Plan of Care (Signed)

## 2024-04-24 NOTE — Progress Notes (Signed)
 Mobility Specialist - Progress Note      04/24/24 1100  Mobility  Activity Ambulated with assistance;Stood at bedside  Level of Assistance Standby assist, set-up cues, supervision of patient - no hands on  Assistive Device None  Distance Ambulated (ft) 160 ft  Range of Motion/Exercises Active  Activity Response Tolerated well  Mobility Referral Yes  Mobility visit 1 Mobility  Mobility Specialist Start Time (ACUTE ONLY) 1131  Mobility Specialist Stop Time (ACUTE ONLY) 1145  Mobility Specialist Time Calculation (min) (ACUTE ONLY) 14 min   Pt resting in bed on RA upon entry. Pt endorses nausea and vomiting this morning (RN notified and aware). Pt STS and ambulates to hallway around NS for 2 laps SBA with no AD. Pt returned to bed and left with needs in reach.   Guido Rumble Mobility Specialist 04/24/24, 12:03 PM

## 2024-04-24 NOTE — Progress Notes (Signed)
 PROGRESS NOTE    Haley Brennan  FMW:[id number] DOB: April 29, 1966 DOA: 04/20/2024 PCP: Lenon Layman ORN, MD  106A/106A-AA  LOS: 1 day   Brief hospital course:   Assessment & Plan: Haley Brennan is a pleasant 58 y.o. female with medical history significant for morbid obesity, HTN, HLD, no significant alcohol use, no prior history of surgeries who came into ED at [facility] complaining of sudden onset of severe epigastric pain started 7 PM last night.    1.  Acute idiopathic pancreatitis - It is not clear whether she had gallstone that she might have passed or any other etiology.  She takes hydrochlorothiazide and also she drinks alcohol very occasionally and last drink was 1 month ago.  It will be difficult to find out what might have caused the problem.  possible explanation could be she might have passed stone as she has a very risk factors to have gallstone given abnormal liver enzymes.   --cont MIVF  Worsening Vomiting 2/2 Partial SBO, not POA --initial CT on presentation showed moderate duodenal diverticulum which was edematous, thought to be due to inflammation or secondary duodenal diverticulitis.  SBO likely caused by progressive small intestine swelling.  Discussed with oncall GenSurg, who agreed and rec NG tube decompression. --place NG tube, and keep on intermittent suctioning. --will hold repeat CT scan for now since pt already had a contrasted study a few days ago and had AKI.  However, if pt were to have increased pain, then pls repeat CT a/p w contrast to rule out perforation. --start ceftriaxone and flagyl for empiric coverage of duodenal diverticulitis, per discussion with GI.   2.  HTN --hold Toprol due to NPO --IV hydralazine 5 mg q6h scheduled, with holding parameter   3.  HLD --hold statin   5.  AKI --cont MIVF   6.  Hypokalemia --supplement PRN with IV  Elevated LFT's --trending down   DVT prophylaxis: Lovenox SQ Code Status: Full code  Family  Communication: husband updated at bedside today Level of care: Med-Surg Dispo:   The patient is from: home Anticipated d/c is to: home Anticipated d/c date is: >3 days   Subjective and Interval History:  Pt reported worsening vomiting, several episodes of large amount.  KUB showed small bowel dilation.  Discussed with oncall GenSurg.  Plan to place NG tube for decompression.     Objective: Vitals:   04/23/24 2009 04/24/24 0346 04/24/24 0734 04/24/24 1554  BP: (!) 155/81 (!) 158/90 (!) 154/80 (!) 160/94  Pulse: 72 73 73 76  Resp:  18 18 18   Temp: 98.4 F (36.9 C) 97.8 F (36.6 C) 98.5 F (36.9 C) 98.6 F (37 C)  TempSrc:  Oral Oral Oral  SpO2: 94% 91% 91% 92%  Weight:      Height:        Intake/Output Summary (Last 24 hours) at 04/24/2024 1829 Last data filed at 04/24/2024 0420 Gross per 24 hour  Intake 1019.92 ml  Output --  Net 1019.92 ml   Filed Weights   04/20/24 1937  Weight: 113.4 kg    Examination:   Constitutional: NAD, AAOx3 HEENT: conjunctivae and lids normal, EOMI CV: No cyanosis.   RESP: normal respiratory effort, on RA Neuro: II - XII grossly intact.   Psych: Normal mood and affect.  Appropriate judgement and reason   Data Reviewed: I have personally reviewed labs and imaging studies  Time spent: 50 minutes  Haley Haber, MD [facility] If 7PM-7AM, please contact night-coverage  04/24/2024, 6:29 PM

## 2024-04-25 ENCOUNTER — Inpatient Hospital Stay

## 2024-04-25 DIAGNOSIS — K567 Ileus, unspecified: Secondary | ICD-10-CM

## 2024-04-25 DIAGNOSIS — K859 Acute pancreatitis without necrosis or infection, unspecified: Secondary | ICD-10-CM | POA: Diagnosis not present

## 2024-04-25 LAB — BASIC METABOLIC PANEL WITH GFR
Anion gap: 14 (ref 5–15)
BUN: 28 mg/dL — ABNORMAL HIGH (ref 6–20)
CO2: 20 mmol/L — ABNORMAL LOW (ref 22–32)
Calcium: 8.3 mg/dL — ABNORMAL LOW (ref 8.9–10.3)
Chloride: 107 mmol/L (ref 98–111)
Creatinine, Ser: 1.16 mg/dL — ABNORMAL HIGH (ref 0.44–1.00)
GFR, Estimated: 54 mL/min — ABNORMAL LOW (ref >60)
Glucose, Bld: 112 mg/dL — ABNORMAL HIGH (ref 70–99)
Potassium: 3 mmol/L — ABNORMAL LOW (ref 3.5–5.1)
Sodium: 141 mmol/L (ref 135–145)

## 2024-04-25 MED ORDER — POTASSIUM CHLORIDE 10 MEQ/100ML IV SOLN
10.0000 meq | INTRAVENOUS | Status: AC
  Administered 2024-04-25 (×5): 10 meq via INTRAVENOUS
  Filled 2024-04-25 (×2): qty 100

## 2024-04-25 MED ORDER — SODIUM CHLORIDE 0.9 % IV SOLN: INTRAVENOUS | Status: DC

## 2024-04-25 NOTE — Progress Notes (Signed)
 Mobility Specialist Progress Note:    04/25/24 0952  Mobility  Activity Ambulated with assistance  Level of Assistance Standby assist, set-up cues, supervision of patient - no hands on  Assistive Device None  Range of Motion/Exercises Active;All extremities  Activity Response Tolerated well  Mobility visit 1 Mobility  Mobility Specialist Start Time (ACUTE ONLY) 0940  Mobility Specialist Stop Time (ACUTE ONLY) [id number]  Mobility Specialist Time Calculation (min) (ACUTE ONLY) 12 min   Pt received in bed, agreeable to mobility. Required SBA to stand and ambulate with no AD. Tolerated well, asx throughout. Returned to room, left pt supine. All needs met.  Sherrilee Ditty Mobility Specialist Please contact via Special educational needs teacher or  Rehab office at [phone]

## 2024-04-25 NOTE — Consult Note (Signed)
 [facility] SURGICAL CONSULTATION NOTE (initial) - cpt: 00746   HISTORY OF PRESENT ILLNESS (HPI):  58 y.o. female initially presented to [facility] ED on 10/08 secondary to abdominal pain. At the time, patient was reporting severe epigastric abdominal pain prior to arrival. She was able to tolerate PO at the time and was without emesis. Work up in the ED was concerning for pancreatitis with elevated lipase and total bilirubin initially 1.4. These have since normalized. She did have RUQ US  without cholelithiasis. MRCP also obtained without evidence of cholelithiasis nor choledocholithiasis. Triglycerides normal as well at 115. She denied any significant alcohol abuse. Unfortunately, she developed emesis yesterday and had KUB concerning for ileus vs SBO. She denied ever feeling nauseous prior to the episode of emesis. She is without previous abdominal surgeries. This morning, she reports feeling better, no significant abdominal pain, emesis has ceased. She denied any flatus but is anxious about passing gas as she is having loose bowel movements with flatus. NGT ordered but not placed given improvement in symptoms. Must recent labs with hypokalemia to 3.0 ad sCr - 1.16. KUB this morning with loops of dilated small bowel, no gastric distension. She reports feeling better.   Surgery is consulted by hospitalist physician Dr. Ellouise Haber, MD in this context for evaluation and management of ileus vs SBO.  PAST MEDICAL HISTORY (PMH):  Past Medical History:  Diagnosis Date   Hyperlipemia    Hypertension    Joint pain    Melanoma (HCC) 1997     PAST SURGICAL HISTORY (PSH):  Past Surgical History:  Procedure Laterality Date   Achiles tendon     bone spur Right    heel   DILATION AND CURETTAGE OF UTERUS  2013   with ablation   SKIN CANCER EXCISION Left 1997   melonoma /upper thigh x2   SKIN LESION EXCISION Left ?   elbow/pre cancerous per pt     MEDICATIONS:  Prior to Admission  medications   Medication Sig Start Date End Date Taking? Authorizing Provider  aspirin 81 MG chewable tablet Chew 81 mg by mouth daily.   Yes [provider]  Cholecalciferol (VITAMIN D3) 2000 UNITS capsule Take 2,000 Units by mouth daily.   Yes [provider]  citalopram (CELEXA) 20 MG tablet Take 10 mg by mouth daily.   Yes [provider]  JARDIANCE 10 MG TABS tablet Take 10 mg by mouth daily.   Yes [provider]  metoprolol (TOPROL-XL) 200 MG 24 hr tablet Take 200 mg by mouth daily.  12/28/13  Yes [provider]  olmesartan-hydrochlorothiazide (BENICAR HCT) 40-25 MG tablet Take 1 tablet by mouth daily. 10/29/18  Yes [provider]  Pyridoxine HCl (VITAMIN B-6 PO) Take by mouth daily.   Yes [provider]  rosuvastatin (CRESTOR) 40 MG tablet Take 40 mg by mouth daily.   Yes [provider]  topiramate (TOPAMAX) 25 MG tablet Take 1 tablet by mouth daily. 08/20/21  Yes [provider]  phentermine (ADIPEX-P) 37.5 MG tablet Take by mouth. Patient not taking: Reported on 04/21/2024 02/21/19   [provider]  traZODone (DESYREL) 50 MG tablet Take 50 mg by mouth at bedtime. 11/25/18   [provider]     ALLERGIES:  No Known Allergies   SOCIAL HISTORY:  Social History   Socioeconomic History   Marital status: Married    Spouse name: Not on file   Number of children: Not on file   Years of  education: Not on file   Highest education level: Not on file  Occupational History   Not on file  Tobacco Use   Smoking status: Never   Smokeless tobacco: Never  Substance and Sexual Activity   Alcohol use: Yes    Alcohol/week: 0.0 standard drinks of alcohol    Comment: occasionally   Drug use: No   Sexual activity: Yes  Other Topics Concern   Not on file  Social History Narrative   ** Merged History Encounter **       Social Drivers of Health   Financial Resource Strain: Low Risk   (03/15/2024)   Received from [facility]   Overall Financial Resource Strain (CARDIA)    Difficulty of Paying Living Expenses: Not very hard  Food Insecurity: No Food Insecurity (04/21/2024)   Hunger Vital Sign    Worried About Running Out of Food in the Last Year: Never true    Ran Out of Food in the Last Year: Never true  Transportation Needs: No Transportation Needs (04/21/2024)   PRAPARE - Administrator, Civil Service (Medical): No    Lack of Transportation (Non-Medical): No  Physical Activity: Not on file  Stress: Not on file  Social Connections: Not on file  Intimate Partner Violence: Not At Risk (04/21/2024)   Humiliation, Afraid, Rape, and Kick questionnaire    Fear of Current or Ex-Partner: No    Emotionally Abused: No    Physically Abused: No    Sexually Abused: No     FAMILY HISTORY:  Family History  Problem Relation Age of Onset   Seizures Father    Cancer Father        Brain   Breast cancer Maternal Aunt 50      REVIEW OF SYSTEMS:  Review of Systems  Constitutional:  Negative for chills and fever.  Respiratory:  Negative for cough and shortness of breath.   Cardiovascular:  Negative for chest pain and palpitations.  Gastrointestinal:  Negative for abdominal pain, nausea and vomiting.  All other systems reviewed and are negative.   VITAL SIGNS:  Temp:  [98.5 F (36.9 C)-98.6 F (37 C)] 98.6 F (37 C) (10/13 0754) Pulse Rate:  [71-78] 72 (10/13 0754) Resp:  [16-18] 18 (10/13 0754) BP: (117-160)/(59-94) 122/59 (10/13 0754) SpO2:  [92 %-98 %] 98 % (10/13 0754)     Height: 5' 8 (172.7 cm) Weight: 113.4 kg BMI (Calculated): 38.02   INTAKE/OUTPUT:  10/12 0701 - 10/13 0700 In: 907.5 [P.O.:120; I.V.:487.6; IV Piggyback:299.9] Out: -   PHYSICAL EXAM:  Physical Exam Vitals and nursing note reviewed. Exam conducted with a chaperone present.  Constitutional:      General: She is not in acute distress.    Appearance: She is  well-developed. She is obese. She is not ill-appearing.     Comments: Resting in bed; NAD  HENT:     Head: Normocephalic and atraumatic.  Eyes:     General: No scleral icterus.    Extraocular Movements: Extraocular movements intact.  Cardiovascular:     Rate and Rhythm: Normal rate.     Heart sounds: Normal heart sounds.  Pulmonary:     Effort: Pulmonary effort is normal. No respiratory distress.  Abdominal:     General: Abdomen is flat. There is no distension.     Palpations: Abdomen is soft.     Tenderness: There is no abdominal tenderness.     Hernia: No hernia is present.  Genitourinary:  Comments: Deferred Skin:    General: Skin is warm and dry.     Coloration: Skin is not jaundiced.  Neurological:     General: No focal deficit present.     Mental Status: She is alert and oriented to person, place, and time.  Psychiatric:        Mood and Affect: Mood normal.        Behavior: Behavior normal.      Labs:     Latest Ref Rng & Units 04/22/2024    6:33 AM 04/21/2024    9:12 AM 04/20/2024    7:39 PM  CBC  WBC 4.0 - 10.5 K/uL 10.4  8.5  9.2   Hemoglobin 12.0 - 15.0 g/dL 86.5  87.1  87.1   Hematocrit 36.0 - 46.0 % 41.6  38.7  38.9   Platelets 150 - 400 K/uL 248  238  260       Latest Ref Rng & Units 04/25/2024    4:44 AM 04/23/2024    3:03 AM 04/22/2024    6:33 AM  CMP  Glucose 70 - 99 mg/dL 887  879  884   BUN 6 - 20 mg/dL 28  26  29    Creatinine 0.44 - 1.00 mg/dL 8.83  8.87  8.72   Sodium 135 - 145 mmol/L 141  141  142   Potassium 3.5 - 5.1 mmol/L 3.0  3.4  3.6   Chloride 98 - 111 mmol/L 107  108  110   CO2 22 - 32 mmol/L 20  24  23    Calcium 8.9 - 10.3 mg/dL 8.3  8.3  8.3   Total Protein 6.5 - 8.1 g/dL  6.8  7.1   Total Bilirubin 0.0 - 1.2 mg/dL  0.8  0.9   Alkaline Phos 38 - 126 U/L  113  149   AST 15 - 41 U/L  36  118   ALT 0 - 44 U/L  143  246     Imaging studies:   CT Abdomen/Pelvis (04/21/2024) personally reviewed with noted duodenal  diverticulum, no other acute intra-abdominal findings, and radiologist report reviewed below:  IMPRESSION: Diverticulosis without diverticulitis.   Prominent duodenal diverticulum without complicating factors.   RUQ US  (04/21/2024) personally reviewed without cholelithiasis, and radiologist report reviewed below:  IMPRESSION: Unremarkable right upper quadrant abdominal ultrasound. No cholelithiasis or biliary ductal dilation identified.   MRCP (04/22/2024) personally reviewed without choledocholithiasis, changes of the pancreas noted, and radiologist report reviewed below:  IMPRESSION: 1. Acute interstitial/edematous pancreatitis. No definite evidence of parenchymal necrosis on this limited noncontrast examination. No loculated peripancreatic fluid collections are identified. 2. No intra or extrahepatic biliary ductal dilation. No intraluminal filling defect within the extrahepatic bile duct to suggest choledocholithiasis. 3. 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 Extensive duodenal diverticular mural and peri-diverticular edema is noted and may relate to the adjacent inflammatory process involving the pancreas though a secondary duodenal diverticulitis could appear similarly. 4. Extensive sigmoid diverticulosis. 5. Small hiatal hernia.   Assessment/Plan:  58 y.o. female with now improved nausea and emesis, admitted with pancreatitis without clear cut etiology, also with noted duodenal diverticulum    - Clinically, her abdomen is benign and patient reports emesis has subsided. I do think it is reasonable to forgo NGT placement at this time unless emesis recurs.  - I do think we can cautiously allow for CLD. She does not intolerance to this previous secondary to taste - If we fail to progress, may  need to consider repeat CT Abdomen/Pelvis   - No need for surgical intervention  - Monitor abdominal examination; on-going bowel function   - Pain control prn;  antiemetics prn - Okay to mobilize   - Further management per primary service; we will follow   All of the above findings and recommendations were discussed with the patient, and all of patient's questions were answered to her expressed satisfaction.  Thank you for the opportunity to participate in this patient's care.   -- Arthea Platt, PA-C [facility] 04/25/2024, 9:32 AM M-F: 7am - 4pm

## 2024-04-25 NOTE — Progress Notes (Signed)
  PROGRESS NOTE    Haley Brennan  FMW:[id number] DOB: [birth year] DOA: 04/20/2024 PCP: Lenon Layman ORN, MD  106A/106A-AA  LOS: 2 days   Brief hospital course:   Assessment & Plan: Haley Brennan is a pleasant 58 y.o. female with medical history significant for morbid obesity, HTN, HLD, no significant alcohol use, no prior history of surgeries who came into ED at [facility] complaining of sudden onset of severe epigastric pain started 7 PM last night.    1.  Acute idiopathic pancreatitis - It is not clear whether she had gallstone that she might have passed or any other etiology.  She takes hydrochlorothiazide and also she drinks alcohol very occasionally and last drink was 1 month ago.  It will be difficult to find out what might have caused the problem.  possible explanation could be she might have passed stone as she has a very risk factors to have gallstone given abnormal liver enzymes.   --cont MIVF  Worsening Vomiting 2/2 Partial SBO, not POA Duodenal diverticulitis --initial CT on presentation showed moderate duodenal diverticulum which was edematous, thought to be due to inflammation or secondary duodenal diverticulitis.  SBO likely caused by progressive small intestine swelling.  Discussed with oncall GenSurg, who agreed and rec NG tube decompression, but since vomiting subsequently improved, pt did not want NG tube. --started ceftriaxone and flagyl for empiric coverage of duodenal diverticulitis, per discussion with GI. --GenSurg consult --advance to low-fat diet, per pt preference --cont empiric abx   2.  HTN --hold home BP meds   3.  HLD --hold statin   5.  AKI --cont MIVF   6.  Hypokalemia --supplement PRN with IV  Elevated LFT's --trending down   DVT prophylaxis: Lovenox SQ Code Status: Full code  Family Communication:  Level of care: Med-Surg Dispo:   The patient is from: home Anticipated d/c is to: home Anticipated d/c date is: 2-3  days   Subjective and Interval History:  Vomiting improved, so pt did not want the NG tube.  Pt wanted a diet that will let her have chicken noodle soup.     Objective: Vitals:   04/24/24 2009 04/25/24 0419 04/25/24 0754 04/25/24 1632  BP: 120/74 117/72 (!) 122/59 126/72  Pulse: 78 71 72 78  Resp: 18 16 18 20   Temp: 98.6 F (37 C) 98.5 F (36.9 C) 98.6 F (37 C) 98.9 F (37.2 C)  TempSrc: Oral Oral Oral   SpO2: 93% 93% 98% 99%  Weight:      Height:        Intake/Output Summary (Last 24 hours) at 04/25/2024 2311 Last data filed at 04/25/2024 1757 Gross per 24 hour  Intake 2394.35 ml  Output --  Net 2394.35 ml   Filed Weights   04/20/24 1937  Weight: 113.4 kg    Examination:   Constitutional: NAD, AAOx3 HEENT: conjunctivae and lids normal, EOMI CV: No cyanosis.   RESP: normal respiratory effort, on RA Neuro: II - XII grossly intact.     Data Reviewed: I have personally reviewed labs and imaging studies  Time spent: 35 minutes  Haley Haber, MD [facility] If 7PM-7AM, please contact night-coverage 04/25/2024, 11:11 PM

## 2024-04-26 ENCOUNTER — Other Ambulatory Visit: Payer: Self-pay

## 2024-04-26 LAB — POTASSIUM: Potassium: 3.2 mmol/L — ABNORMAL LOW (ref 3.5–5.1)

## 2024-04-26 MED ORDER — POTASSIUM CHLORIDE 20 MEQ PO PACK: 40.0000 meq | PACK | Freq: Every day | ORAL | Status: DC

## 2024-04-26 MED ORDER — POTASSIUM CHLORIDE 20 MEQ PO PACK
40.0000 meq | PACK | Freq: Once | ORAL | Status: AC
  Administered 2024-04-26: 40 meq via ORAL
  Filled 2024-04-26: qty 2

## 2024-04-26 MED ORDER — POTASSIUM CHLORIDE ER 10 MEQ PO TBCR
40.0000 meq | EXTENDED_RELEASE_TABLET | Freq: Every day | ORAL | 0 refills | Status: AC
  Filled 2024-04-26: qty 20, 5d supply, fill #0
  Filled 2024-04-26: qty 10, 5d supply, fill #0

## 2024-04-26 MED ORDER — AMOXICILLIN-POT CLAVULANATE 875-125 MG PO TABS
1.0000 | ORAL_TABLET | Freq: Two times a day (BID) | ORAL | 0 refills | Status: AC
Start: 2024-04-27 — End: 2024-04-29
  Filled 2024-04-26: qty 4, 2d supply, fill #0

## 2024-04-26 MED ORDER — POTASSIUM CHLORIDE 10 MEQ/100ML IV SOLN
10.0000 meq | INTRAVENOUS | Status: DC
  Administered 2024-04-26: 10 meq via INTRAVENOUS
  Filled 2024-04-26: qty 100

## 2024-04-26 NOTE — Plan of Care (Signed)
  Problem: Education: Goal: Knowledge of General Education information will improve Description: Including pain rating scale, medication(s)/side effects and non-pharmacologic comfort measures Outcome: Progressing   Problem: Health Behavior/Discharge Planning: Goal: Ability to manage health-related needs will improve Outcome: Progressing   Problem: Clinical Measurements: Goal: Ability to maintain clinical measurements within normal limits will improve Outcome: Progressing Goal: Will remain free from infection Outcome: Progressing Goal: Diagnostic test results will improve Outcome: Progressing Goal: Respiratory complications will improve Outcome: Progressing Goal: Cardiovascular complication will be avoided Outcome: Progressing   Problem: Activity: Goal: Risk for activity intolerance will decrease Outcome: Progressing   Problem: Nutrition: Goal: Adequate nutrition will be maintained Outcome: Progressing   Problem: Coping: Goal: Level of anxiety will decrease Outcome: Progressing   Problem: Elimination: Goal: Will not experience complications related to bowel motility Outcome: Progressing Goal: Will not experience complications related to urinary retention Outcome: Progressing   Problem: Pain Managment: Goal: General experience of comfort will improve and/or be controlled Outcome: Progressing   Problem: Safety: Goal: Ability to remain free from injury will improve Outcome: Progressing   Problem: Skin Integrity: Goal: Risk for impaired skin integrity will decrease Outcome: Progressing   Problem: Education: Goal: Knowledge of General Education information will improve Description: Including pain rating scale, medication(s)/side effects and non-pharmacologic comfort measures Outcome: Progressing   Problem: Health Behavior/Discharge Planning: Goal: Ability to manage health-related needs will improve Outcome: Progressing   Problem: Health Behavior/Discharge  Planning: Goal: Ability to manage health-related needs will improve Outcome: Progressing   Problem: Clinical Measurements: Goal: Ability to maintain clinical measurements within normal limits will improve Outcome: Progressing   Problem: Clinical Measurements: Goal: Will remain free from infection Outcome: Progressing   Problem: Clinical Measurements: Goal: Diagnostic test results will improve Outcome: Progressing   Problem: Clinical Measurements: Goal: Respiratory complications will improve Outcome: Progressing   Problem: Clinical Measurements: Goal: Cardiovascular complication will be avoided Outcome: Progressing   Problem: Activity: Goal: Risk for activity intolerance will decrease Outcome: Progressing   Problem: Nutrition: Goal: Adequate nutrition will be maintained Outcome: Progressing   Problem: Coping: Goal: Level of anxiety will decrease Outcome: Progressing   Problem: Elimination: Goal: Will not experience complications related to bowel motility Outcome: Progressing   Problem: Elimination: Goal: Will not experience complications related to urinary retention Outcome: Progressing   Problem: Pain Managment: Goal: General experience of comfort will improve and/or be controlled Outcome: Progressing   Problem: Safety: Goal: Ability to remain free from injury will improve Outcome: Progressing   Problem: Skin Integrity: Goal: Risk for impaired skin integrity will decrease Outcome: Progressing

## 2024-04-26 NOTE — Plan of Care (Signed)

## 2024-04-26 NOTE — Progress Notes (Addendum)
 Mobility Specialist - Progress Note    04/26/24 1000  Mobility  Activity Ambulated with assistance;Stood at bedside  Level of Assistance Standby assist, set-up cues, supervision of patient - no hands on  Assistive Device None;Other (Comment) (IV POLE)  Distance Ambulated (ft) 320 ft  Range of Motion/Exercises Active  Activity Response Tolerated well  Mobility Referral Yes  Mobility visit 1 Mobility  Mobility Specialist Start Time (ACUTE ONLY) 1000  Mobility Specialist Stop Time (ACUTE ONLY) 1015  Mobility Specialist Time Calculation (min) (ACUTE ONLY) 15 min   Pt resting in bed on RA upon entry. Pt STS and ambulates to hallway around NS SBA with no AD for 2 laps. Pt returned to bed and left with needs in reach.   Guido Rumble Mobility Specialist 04/26/24, 10:59 AM

## 2024-04-26 NOTE — Progress Notes (Signed)
 Haley Brennan, Haley Brennan, Haley Brennan, Haley Brennan, chills  HEENT: denies cough or congestion  Respiratory: denies any shortness of breath  Cardiovascular: denies chest Brennan or palpitations  Gastrointestinal: denies abdominal Brennan, N/V Genitourinary: denies burning with urination or urinary frequency Musculoskeletal: denies Brennan, decreased motor or sensation   Vital signs in last 24 hours: [min-max] current  Temp:  [98.4 F (36.9 C)-98.9 F (37.2 C)] 98.8 F (37.1 C) (10/14 0834) Pulse Rate:  [77-92] 92 (10/14 0834) Resp:  [18-20] 19 (10/14 0834) BP: (126-146)/(56-82) 142/82 (10/14 0834) SpO2:  [96 %-99 %] 96 % (10/14 0834)     Height: 5' 8 (172.7 cm) Weight: 113.4 kg BMI (Calculated): 38.02   Intake/Output last 2 shifts:  10/13 0701 - 10/14 0700 In: 1606.8 [I.V.:963; IV Piggyback:643.8] Out: -    Physical Exam:  Constitutional: alert, cooperative and Haley distress  HENT: normocephalic without obvious abnormality  Eyes: PERRL, EOM's grossly intact and symmetric  Respiratory: breathing non-labored at rest  Cardiovascular: regular rate and sinus rhythm  Gastrointestinal: soft, non-tender, and non-distended   Labs:     Latest Ref Rng & Units 04/22/2024    6:33 AM 04/21/2024    9:12 AM 04/20/2024    7:39 PM  CBC  WBC 4.0 - 10.5 K/uL 10.4  8.5  9.2   Hemoglobin 12.0 - 15.0 g/dL 86.5  87.1  87.1   Hematocrit 36.0 - 46.0 % 41.6  38.7  38.9   Platelets 150 - 400 K/uL 248  238  260       Latest Ref Rng & Units 04/26/2024    5:25 AM 04/25/2024    4:44 AM 04/23/2024    3:03 AM  CMP  Glucose 70 - 99 mg/dL  887  879   BUN 6 - 20  mg/dL  28  26   Creatinine 9.55 - 1.00 mg/dL  8.83  8.87   Sodium 864 - 145 mmol/L  141  141   Potassium 3.5 - 5.1 mmol/L 3.2  3.0  3.4   Chloride 98 - 111 mmol/L  107  108   CO2 22 - 32 mmol/L  20  24   Calcium 8.9 - 10.3 mg/dL  8.3  8.3   Total Protein 6.5 - 8.1 g/dL   6.8   Total Bilirubin 0.0 - 1.2 mg/dL   0.8   Alkaline Phos 38 - 126 U/L   113   AST 15 - 41 U/L   36   ALT 0 - 44 U/L   143      Imaging studies: Haley new pertinent imaging studies   Assessment/Plan:  58 y.o. female with now resolving ileus, admitted with pancreatitis without clear cut etiology, also with noted duodenal diverticulum                - Okay to continue diet as tolerated             - Haley need for surgical intervention             - Monitor abdominal examination; on-going bowel function              - Brennan  control prn; antiemetics prn - Okay to mobilize              - Further management per primary service    - Okay for discharge from surgical perspective; recommend low fat diet for home. She will need referral to GI for evaluation/consideration of endoscopy for duodenal diverticulum once recovered from this.   All of the above findings and recommendations were discussed with the patient, and the medical team, and all of patient's questions were answered to his expressed satisfaction.  -- Arthea Platt, PA-C [facility] 04/26/2024, 9:21 AM M-F: 7am - 4pm

## 2024-04-26 NOTE — Discharge Summary (Addendum)
 Physician Discharge Summary   Haley Brennan  female DOB: 01-11-1966  FMW:[id number]  PCP: Lenon Layman ORN, MD  Admit date: 04/20/2024 Discharge date: 04/26/2024  Admitted From: home Disposition:  home CODE STATUS: Full code  Discharge Instructions     Ambulatory referral to Gastroenterology   Complete by: As directed    Duodenal diverticulitis   What is the reason for referral?: Other   Discharge diet:   Complete by: As directed    Low fat diet. [facility] Course:  For full details, please see H&P, progress notes, consult notes and ancillary notes.  Briefly,  Haley Brennan is a pleasant 58 y.o. female with medical history significant for morbid obesity, HTN, no significant alcohol use, no prior history of surgeries who came into ED at [facility] complaining of sudden onset of severe epigastric pain.    1.  Acute idiopathic pancreatitis - It is not clear whether she had gallstone that she might have passed given abnormal liver enzymes.   --Lipase 1349 on presentation  received MIVF, with lipase trended down to 51 two days after presentation.  Pain improved and pt was tolerating low-fat diet, and pt asked to be discharged.   Worsening Vomiting 2/2 Partial SBO, not POA Duodenal diverticulitis --initial CT on presentation showed moderate duodenal diverticulum which was edematous, thought to be due to inflammation or secondary duodenal diverticulitis.  SBO likely caused by progressive small intestine swelling.  Discussed with oncall GenSurg, who agreed and rec NG tube decompression, but since vomiting subsequently improved, pt did not want NG tube.  Pt has also had multiple BM's.  On the day of discharge, pt reported pain improved and was able to tolerate low-fat diet, and asked to be discharge.  GenSurg cleared pt for discharge. --started ceftriaxone and flagyl for empiric coverage of duodenal diverticulitis, per discussion with GI.  Pt received 3 days of  ceftriaxone and flagyl, and discharged on 2 more days of augmentin. --outpatient GI referral in 6 to 8 weeks for possible endoscopy given duodenal diverticulum.   2.  HTN --cont home Toprol --d/c'ed home olmesartan-hydrochlorothiazide    3.  HLD --resume statin after discharge   5.  AKI, ruled out Likely CKD 3a   6.  Hypokalemia --supplemented PRN with IV --discharged on oral Potassium 40 mEq for 5 days   Elevated LFT's --trending down   Unless noted above, medications under STOP list are ones pt was not taking PTA.  Discharge Diagnoses:  Principal Problem:   Acute pancreatitis Active Problems:   Asthma without status asthmaticus   BMI 40.0-44.9, adult (HCC)   HTN (hypertension), benign   Hyperlipidemia   Ileus (HCC)   30 Day Unplanned Readmission Risk Score    Flowsheet Row ED to Hosp-Admission (Current) from 04/20/2024 in [facility] 1C MEDICAL TELEMETRY  30 Day Unplanned Readmission Risk Score (%) 12.97 Filed at 04/26/2024 1200    This score is the patient's risk of an unplanned readmission within 30 days of being discharged (0 -100%). The score is based on dignosis, age, lab data, medications, orders, and past utilization.   Low:  0-14.9   Medium: 15-21.9   High: 22-29.9   Extreme: 30 and above         Discharge Instructions:  Allergies as of 04/26/2024   No Known Allergies      Medication List     STOP taking these medications    olmesartan-hydrochlorothiazide 40-25 MG tablet  Commonly known as: BENICAR HCT   phentermine 37.5 MG tablet Commonly known as: ADIPEX-P       TAKE these medications    amoxicillin-clavulanate 875-125 MG tablet Commonly known as: AUGMENTIN Take 1 tablet by mouth 2 (two) times daily for 2 days. Start taking on: April 27, 2024   aspirin 81 MG chewable tablet Chew 81 mg by mouth daily.   citalopram 20 MG tablet Commonly known as: CELEXA Take 10 mg by mouth daily.   Jardiance 10 MG Tabs  tablet Generic drug: empagliflozin Take 10 mg by mouth daily.   metoprolol 200 MG 24 hr tablet Commonly known as: TOPROL-XL Take 200 mg by mouth daily.   potassium chloride 20 MEQ packet Commonly known as: KLOR-CON Take 40 mEq by mouth daily for 5 days. Start taking on: April 27, 2024   rosuvastatin 40 MG tablet Commonly known as: CRESTOR Take 40 mg by mouth daily.   topiramate 25 MG tablet Commonly known as: TOPAMAX Take 1 tablet by mouth daily.   traZODone 50 MG tablet Commonly known as: DESYREL Take 50 mg by mouth at bedtime.   VITAMIN B-6 PO Take by mouth daily.   Vitamin D3 50 MCG (2000 UT) capsule Take 2,000 Units by mouth daily.         Follow-up Information     Lenon Layman ORN, MD Follow up.   Specialty: Internal Medicine Why: hospital follow up Contact information: [address] [place] [address] [phone]         Therisa Bi, MD Follow up in 1 month(s).   Specialty: Gastroenterology Why: for duodenal diverticulum Contact information: [address] [place] [address] [phone]                 No Known Allergies   The results of significant diagnostics from this hospitalization (including imaging, microbiology, ancillary and laboratory) are listed below for reference.   Consultations:   Procedures/Studies: MR 3D Recon At Scanner Result Date: 04/26/2024 CLINICAL DATA:  RUQ abdominal pain, biliary disease suspected, no prior imaging EXAM: MRI ABDOMEN WITHOUT CONTRAST  (INCLUDING MRCP) TECHNIQUE: Multiplanar multisequence MR imaging of the abdomen was performed. Heavily T2-weighted images of the biliary and pancreatic ducts were obtained, and three-dimensional MRCP images were rendered by post processing. COMPARISON:  CT 04/20/2024 FINDINGS: Lower chest: No acute findings.  Small hiatal hernia Hepatobiliary: The liver demonstrates normal parenchymal signal characteristics no focal intrahepatic mass  identified. There is periportal edema present. No intra or extrahepatic biliary ductal dilation. No intraluminal filling defect within the extrahepatic bile duct to suggest choledocholithiasis. Pancreas: There is interstitial edema and peripancreatic edema diffusely involving the pancreas in keeping with changes of acute interstitial/edematous pancreatitis. No definite evidence of parenchymal necrosis on this limited noncontrast examination. No loculated peripancreatic fluid collections are identified. The pancreatic duct is dilated. Spleen:  Within normal limits in size and appearance. Adrenals/Urinary Tract: The adrenal glands are unremarkable. The kidneys are normal in size and position. Simple parapelvic cysts are seen within the kidneys bilaterally for which no follow-up imaging is recommended. Air no intrarenal masses. No hydronephrosis. Stomach/Bowel: Moderate duodenal diverticulum is identified in the region of the ampulla. Extensive diverticular mural and peri-diverticular edema is noted and may relate to the adjacent inflammatory process involving the pancreas though a secondary duodenal diverticulitis could appear similarly. Extensive sigmoid diverticulosis incidentally noted. Visualized stomach, large and small bowel are otherwise unremarkable. Vascular/Lymphatic: Preserved flow voids within the portal vein, splenic vein, and superior mesenteric  vein. The abdominal vasculature is unremarkable. Other:  None. Musculoskeletal: No suspicious bone lesions identified. IMPRESSION: 1. Acute interstitial/edematous pancreatitis. No definite evidence of parenchymal necrosis on this limited noncontrast examination. No loculated peripancreatic fluid collections are identified. 2. No intra or extrahepatic biliary ductal dilation. No intraluminal filling defect within the extrahepatic bile duct to suggest choledocholithiasis. 3. 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 Extensive duodenal  diverticular mural and peri-diverticular edema is noted and may relate to the adjacent inflammatory process involving the pancreas though a secondary duodenal diverticulitis could appear similarly. 4. Extensive sigmoid diverticulosis. 5. Small hiatal hernia. Electronically Signed   By: Dorethia Molt M.D.   On: 04/26/2024 08:24   DG Abd 1 View Result Date: 04/25/2024 CLINICAL DATA:  Abdominal distention. EXAM: ABDOMEN - 1 VIEW COMPARISON:  04/24/2024 CT scan 04/20/2024 FINDINGS: Gaseous distention of small bowel in the abdomen is similar to prior measuring up to 4.5 cm today compared to 4.6 cm yesterday. No gaseous distention of the colon. IMPRESSION: Similar persistent gaseous distention of small bowel in the abdomen. Electronically Signed   By: Camellia Candle M.D.   On: 04/25/2024 08:14   DG Abd 1 View Result Date: 04/24/2024 CLINICAL DATA:  [id number] Nausea AND vomiting [id number]. EXAM: ABDOMEN - 1 VIEW COMPARISON:  None Available. FINDINGS: There is gaseous dilatation of small bowel loops (diameter up to 4.6 cm), which is disproportionate to the degree of distention of the colon, favoring at least partial small bowel obstruction. No evidence of pneumoperitoneum, within the limitations of a supine film. No acute osseous abnormalities. The soft tissues are within normal limits. Surgical changes, devices, tubes and lines: None. IMPRESSION: Findings favoring at least partial small bowel obstruction. Electronically Signed   By: Ree Molt M.D.   On: 04/24/2024 14:50   US  Abdomen Limited RUQ (LIVER/GB) Result Date: 04/21/2024 CLINICAL DATA:  Acute pancreatitis EXAM: ULTRASOUND ABDOMEN LIMITED RIGHT UPPER QUADRANT COMPARISON:  Same day MRI/MRCP and CT performed April 20, 2024 FINDINGS: Gallbladder: No gallstones or wall thickening visualized. No sonographic Murphy sign noted by sonographer. Common bile duct: Diameter: 3.4 mm. Liver: No focal lesion identified. Within normal limits in parenchymal echogenicity.  Portal vein is patent on color Doppler imaging with normal direction of blood flow towards the liver. Other: None. IMPRESSION: Unremarkable right upper quadrant abdominal ultrasound. No cholelithiasis or biliary ductal dilation identified. Electronically Signed   By: Michaeline Blanch M.D.   On: 04/21/2024 14:24   MR ABDOMEN MRCP WO CONTRAST Result Date: 04/21/2024 CLINICAL DATA:  RUQ abdominal pain, biliary disease suspected, no prior imaging EXAM: MRI ABDOMEN WITHOUT CONTRAST  (INCLUDING MRCP) TECHNIQUE: Multiplanar multisequence MR imaging of the abdomen was performed. Heavily T2-weighted images of the biliary and pancreatic ducts were obtained, and three-dimensional MRCP images were rendered by post processing. COMPARISON:  CT 04/20/2024 FINDINGS: Lower chest: No acute findings.  Small hiatal hernia Hepatobiliary: The liver demonstrates normal parenchymal signal characteristics no focal intrahepatic mass identified. There is periportal edema present. No intra or extrahepatic biliary ductal dilation. No intraluminal filling defect within the extrahepatic bile duct to suggest choledocholithiasis. Pancreas: There is interstitial edema and peripancreatic edema diffusely involving the pancreas in keeping with changes of acute interstitial/edematous pancreatitis. No definite evidence of parenchymal necrosis on this limited noncontrast examination. No loculated peripancreatic fluid collections are identified. The pancreatic duct is dilated. Spleen:  Within normal limits in size and appearance. Adrenals/Urinary Tract: The adrenal glands are unremarkable. The kidneys are normal in size and position.  Simple parapelvic cysts are seen within the kidneys bilaterally for which no follow-up imaging is recommended. Air no intrarenal masses. No hydronephrosis. Stomach/Bowel: Moderate duodenal diverticulum is identified in the region of the ampulla. Extensive diverticular mural and peri-diverticular edema is noted and may relate to  the adjacent inflammatory process involving the pancreas though a secondary duodenal diverticulitis could appear similarly. Extensive sigmoid diverticulosis incidentally noted. Visualized stomach, large and small bowel are otherwise unremarkable. Vascular/Lymphatic: Preserved flow voids within the portal vein, splenic vein, and superior mesenteric vein. The abdominal vasculature is unremarkable. Other:  None. Musculoskeletal: No suspicious bone lesions identified. IMPRESSION: 1. Acute interstitial/edematous pancreatitis. No definite evidence of parenchymal necrosis on this limited noncontrast examination. No loculated peripancreatic fluid collections are identified. 2. No intra or extrahepatic biliary ductal dilation. No intraluminal filling defect within the extrahepatic bile duct to suggest choledocholithiasis. 3. 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 Extensive duodenal diverticular mural and peri-diverticular edema is noted and may relate to the adjacent inflammatory process involving the pancreas though a secondary duodenal diverticulitis could appear similarly. 4. Extensive sigmoid diverticulosis. 5. Small hiatal hernia. Electronically Signed   By: Dorethia Molt M.D.   On: 04/21/2024 04:58   CT ABDOMEN PELVIS W CONTRAST Result Date: 04/21/2024 CLINICAL DATA:  Upper abdominal pain with elevated lipase, initial encounter EXAM: CT ABDOMEN AND PELVIS WITH CONTRAST TECHNIQUE: Multidetector CT imaging of the abdomen and pelvis was performed using the standard protocol following bolus administration of intravenous contrast. RADIATION DOSE REDUCTION: This exam was performed according to the departmental dose-optimization program which includes automated exposure control, adjustment of the mA and/or kV according to patient size and/or use of iterative reconstruction technique. CONTRAST:  80mL OMNIPAQUE IOHEXOL 300 MG/ML  SOLN COMPARISON:  None Available. FINDINGS: Lower chest: No acute  abnormality. Hepatobiliary: Gallbladder is well distended although no cholelithiasis is seen. Liver is unremarkable. Pancreas: Unremarkable. No pancreatic ductal dilatation or surrounding inflammatory changes. Prominent duodenal diverticulum is noted adjacent to the head of the pancreas. Spleen: Normal in size without focal abnormality. Adrenals/Urinary Tract: Adrenal glands are within normal limits. Kidneys demonstrate a normal enhancement pattern bilaterally. No renal calculi are noted. Peripelvic cysts are seen bilaterally slightly worse on the left than the right. No follow-up is recommended. The bladder is decompressed. Stomach/Bowel: Scattered diverticular change of the colon is noted without evidence of diverticulitis. No obstructive changes are seen. The appendix is not well visualized. No inflammatory changes are seen. Stomach is decompressed. Small bowel demonstrates a prominent duodenal diverticulum adjacent to the head of the pancreas. Vascular/Lymphatic: Aortic atherosclerosis. No enlarged abdominal or pelvic lymph nodes. Reproductive: Uterus and bilateral adnexa are unremarkable. Other: No abdominal wall hernia or abnormality. No abdominopelvic ascites. Musculoskeletal: No acute or significant osseous findings. IMPRESSION: Diverticulosis without diverticulitis. Prominent duodenal diverticulum without complicating factors. Electronically Signed   By: Oneil Devonshire M.D.   On: 04/21/2024 00:08   DG Chest 2 View Result Date: 04/20/2024 CLINICAL DATA:  Abdominal pain. Upper abdominal pain. Symptoms began today. EXAM: CHEST - 2 VIEW COMPARISON:  04/24/2009 FINDINGS: Normal heart size and pulmonary vascularity. No focal airspace disease or consolidation in the lungs. No blunting of costophrenic angles. No pneumothorax. Mediastinal contours appear intact. Degenerative changes in the spine. IMPRESSION: No active cardiopulmonary disease. Electronically Signed   By: Elsie Gravely M.D.   On: 04/20/2024 20:09       Labs: BNP (last 3 results) No results for input(s): BNP in the last 8760 hours. Basic Metabolic Panel:  Recent Labs  Lab 04/20/24 1939 04/21/24 0912 04/22/24 9366 04/23/24 0303 04/25/24 0444 04/26/24 0525  NA 140  --  142 141 141  --   K 3.1*  --  3.6 3.4* 3.0* 3.2*  CL 103  --  110 108 107  --   CO2 25  --  23 24 20*  --   GLUCOSE 115*  --  115* 120* 112*  --   BUN 29*  --  29* 26* 28*  --   CREATININE 1.42* 1.41* 1.27* 1.12* 1.16*  --   CALCIUM 9.1  --  8.3* 8.3* 8.3*  --   MG  --   --  2.1 2.1  --   --    Liver Function Tests: Recent Labs  Lab 04/20/24 2337 04/22/24 0633 04/23/24 0303  AST 406* 118* 36  ALT 189* 246* 143*  ALKPHOS 155* 149* 113  BILITOT 1.4* 0.9 0.8  PROT 8.0 7.1 6.8  ALBUMIN 4.1 3.4* 3.2*   Recent Labs  Lab 04/20/24 1939 04/22/24 0633  LIPASE 1,349* 51   No results for input(s): AMMONIA in the last 168 hours. CBC: Recent Labs  Lab 04/20/24 1939 04/21/24 0912 04/22/24 0633  WBC 9.2 8.5 10.4  HGB 12.8 12.8 13.4  HCT 38.9 38.7 41.6  MCV 90.3 89.8 92.2  PLT 260 238 248   Cardiac Enzymes: No results for input(s): CKTOTAL, CKMB, CKMBINDEX, TROPONINI in the last 168 hours. BNP: Invalid input(s): POCBNP CBG: No results for input(s): GLUCAP in the last 168 hours. D-Dimer No results for input(s): DDIMER in the last 72 hours. Hgb A1c No results for input(s): HGBA1C in the last 72 hours. Lipid Profile No results for input(s): CHOL, HDL, LDLCALC, TRIG, CHOLHDL, LDLDIRECT in the last 72 hours. Thyroid function studies No results for input(s): TSH, T4TOTAL, T3FREE, THYROIDAB in the last 72 hours.  Invalid input(s): FREET3 Anemia work up No results for input(s): VITAMINB12, FOLATE, FERRITIN, TIBC, IRON, RETICCTPCT in the last 72 hours. Urinalysis    Component Value Date/Time   COLORURINE YELLOW (A) 04/20/2024 1945   APPEARANCEUR HAZY (A) 04/20/2024 1945   APPEARANCEUR Clear  09/13/2020 1302   LABSPEC 1.020 04/20/2024 1945   PHURINE 6.0 04/20/2024 1945   GLUCOSEU >=500 (A) 04/20/2024 1945   HGBUR NEGATIVE 04/20/2024 1945   BILIRUBINUR NEGATIVE 04/20/2024 1945   BILIRUBINUR Negative 09/13/2020 1302   KETONESUR NEGATIVE 04/20/2024 1945   PROTEINUR NEGATIVE 04/20/2024 1945   NITRITE NEGATIVE 04/20/2024 1945   LEUKOCYTESUR SMALL (A) 04/20/2024 1945   Sepsis Labs Recent Labs  Lab 04/20/24 1939 04/21/24 0912 04/22/24 0633  WBC 9.2 8.5 10.4   Microbiology No results found for this or any previous visit (from the past 240 hours).   Total time spend on discharging this patient, including the last patient exam, discussing the hospital stay, instructions for ongoing care as it relates to all pertinent caregivers, as well as preparing the medical discharge records, prescriptions, and/or referrals as applicable, is 35 minutes.    Ellouise Haber, MD  [facility] 04/26/2024, 1:46 PM

## 2024-04-28 NOTE — H&P (Signed)
 Please see my consult note of the same day as the [facility] and P[facility] Haley Brennan

## 2024-04-28 NOTE — ED Notes (Signed)
 [facility] Emergency Department Attestation Note     ED Clinical Impression    Final diagnoses:  Small bowel obstruction (CMS-HCC) (Primary)      ED Attending Physician Teaching Attestation    I supervised care provided by the resident. We have discussed the case, I have reviewed the note and I agree with the plan of treatment except as documented in my note. I saw and evaluated the patient, participating in the key portions of the patient's care.      ED Attending Note    Outside Historian(s)  None  Vitals:   04/28/24 0930 04/28/24 1338 04/28/24 1715  BP: 157/99 145/89   Pulse: 86 68   Resp: 18 16   Temp: 36.8 C (98.2 F) 36.8 C (98.2 F)   TempSrc: Oral Oral   SpO2: 99% 100%   Weight: (!) 115.1 kg (253 lb 11.2 oz)  (!) 115.5 kg (254 lb 9.6 oz)  Height: 172.7 cm (5' 8)      Haley Brennan is a 58 y.o. female with PMH of  HTN, HLD, insomnia, anxiety/depression, and CKD stage IIIa presenting for emesis with nausea and constipation secondary to an infected diverticula.    Impression, Medical Decision Making and Plan of Care  Given the broad differential of abdominal pain, will obtain basic labs.  Lipase to evaluate for pancreatitis.  CBC to evaluate for leukocytosis or anemia.  Chemistry to evaluate for electrolyte abnormalities, acidosis or alkalosis, and liver function tests to evaluate/screen for hepato-biliary disease.  Urinalysis to evaluate for cystitis, pyelonephritis, or hematuria suggest renal colic.  Given recent history of pancreatitis, diverticulitis and SBO requiring NG tube will also evaluate for possible recurrence of SBO, ileitis, intra-abdominal abscess, pancreatitis, among other intra-abdominal pathology with CT abdomen pelvis.    Progress Notes and Critical Care  CT abdomen pelvis findings concerning for likely small bowel obstruction with incision point in the distal ileum and acute edematous interstitial pancreatitis.  Plan  to consult general surgery, patient will require admission.  Additional MDM Elements  MDM Elements Discussion with other professionals: Consultant - General Surgery will evaluate patient.  I have reviewed recent and relavant previous record, including: Outpatient notes - [place] Admission on 04/20/2024 for PMH and HPI Escalation of Care including OBS/Admission/Transfer was considered: Admit Social Determinants that significantly affected care: None     I have reviewed the patient's vital signs and the nursing notes. Any pertinent labs & imaging results which were available during my care of the patient were reviewed by me. See chart, resident and nursing documentation for additional ED course details.  Portions of this record have been created using Scientist, clinical (histocompatibility and immunogenetics). Dictation errors have been sought, but may not have been identified and corrected.  Documentation assistance was provided by Schuyler Gibney, Scribe on April 28, 2024 at 10:29 AM for Eluterio Led, MD.  April 28, 2024 5:25 PM. Documentation assistance provided by the scribe. I was present during the time the encounter was recorded. The information recorded by the scribe was done at my direction and has been reviewed and validated by me.

## 2024-04-28 NOTE — Consults (Signed)
 [facility] H&P NOTE  Patient Name: Haley Brennan Medical Record Number: [id number] Referring Provider: Cleatus Katz, MD    Assessment and Plan: Haley Brennan is a 58 y.o. female with a past medical history of hypertension, hyperlipidemia, and recent hospitalization for pancreatitis (discharged 04/26/2024) that presented to the emergency department on 04/28/2024 due to vomiting and abdominal bloating.  She is afebrile and hemodynamically stable. She does not have a leukocytosis.  AST is elevated to 78 ALT to 90, and lipase is 207.  CT abdomen pelvis revealed small bowel obstruction with transition point in the distal ileum and acute edematous interstitial pancreatitis.  On exam, her abdomen is soft but distended.  We are recommending admission for SBO management. Will possibly obtain Gastrografin challenge tomorrow. Will admit under the general surgery service.  Plan - NPO, NG tube - IV fluids - Modal pain management - Labs in a.m. -Monitor intake and output    Neuro   PRN Tylenol , Oxycodone   Pulmonary   O2 sats 100 on RA.  Encourage IS and ambulation.  CV   AFVSS.  Continue vitals per protocol.  FEN/GI   Zofran  for nausea  GU   Voiding spontaneously without issue.  PPx   Lovenox PPX  Wound   N/A  Dispo   Can D/C when meeting goals of care - tolerating PO diet without N/V and tolerating PO pain medication.    History of Present Illness: Haley Brennan is a 58 y.o. female with a past medical history of hypertension, hyperlipidemia, and recent hospitalization for pancreatitis (discharged 04/26/2024) that presented to the emergency department on 04/28/2024 due to vomiting and abdominal bloating.  Reports that today she vomited bile containing contents at 7 AM before breakfast.  Denies any nausea.  She states that when she eats she feels it stuck in her throat and immediately vomits.  This is associated with abdominal distention.  Prior to this episode she  vomited on Sunday in the hospital.  Reports abdominal discomfort in the epigastric region.  She has been having small green bowel movements.  She cannot recall the last time she passed flatus.  She endorses frequent burping.  Denies fever and chills.  Denies chest pain and shortness of breath.  She has had no prior episodes of abdominal pain other than when she was treated for diverticulitis back a few years ago.  Of note, she has no pertinent abdominal surgical history.  Past Medical History: Past Medical History[1]  Past Surgical History:  Past Surgical History[2]  Social History: Works as a Catering manager  Tobacco use: denies  Alcohol use: occassional use Drug use: denies  Family History:  The patient's family history is not on file..  Review of systems:  A 12 system review of systems was negative except as noted in HPI  Medication: Current Medications[3] Metoprolol Olmesartan Rosuvastatin Trazodone Citalopram Topamax   Allergies: Patient has no known allergies.  BP 145/89   Pulse 68   Temp 36.8 C (98.2 F) (Oral)   Resp 16   Ht 172.7 cm (5' 8)   Wt (!) 115.1 kg (253 lb 11.2 oz)   SpO2 100%   BMI 38.57 kg/m  Body mass index is 38.57 kg/m.  Physical exam: Consitutional: NAD Eyes: anicteric CV: Regular rate Resp: CTAB Abd: Soft, mildly tender in epigastric region, mildly distended , Murphy sign is negative  Musculoskeletal: 5/5 strength throughout Skin: Warm and dry Psychiatric: Answering questions appropriately  Imaging: (personally reviewed)  Michelina Nettle PA-C  I saw and evaluated the patient and discussed the assessment and plan with the APP.  I provided the required supervision for all services billed.  I independently reviewed the image(s) and agree with the interpretation and plan as documented in the APP's note. VICENTA GORMAN GEE, MD       [1] No past medical history on file. [2] No past surgical history on file. [3] No current  facility-administered medications for this encounter.   No current outpatient medications on file.

## 2024-04-28 NOTE — ED Triage Notes (Signed)
 Patient states she was discharged from hospital admission at [facility] for pancreatitis and swollen liver and she was just called and told she has infected diverticuli. Patient reporting nausea and constipation

## 2024-04-28 NOTE — ED Provider Notes (Signed)
 [facility] Emergency Department Provider Note    ED Clinical Impression     Diagnosis ICD-10-CM Associated Orders  1. Small bowel obstruction (CMS-HCC)  K56.609           Impression, Medical Decision Making, Progress Notes and Critical Care    Medical Decision Making A 58 year old woman with recent hospitalization for acute pancreatitis, partial small bowel obstruction, and hypokalemia presents with bile regurgitation, lower abdominal discomfort, and decreased bowel movements.  She was discharged on Tuesday after resolution of her pancreatitis and was given a course of antibiotics which she has completed for diverticulitis that was found on imaging and was planning to follow up outpatient with gastroenterology.  Since that time she has been able to eat and drink but has had only a few very small green, non-bloody bowel movements.  Today she had 2 episodes of approximately 200 ml of green emesis after experiencing a feeling of something coming back up into her throat. The feelings occur after she is lying flat.  She has been able to tolerate a small amount of fluids since her episode of emesis this morning.  She denies any current abdominal pain but does feel that she is full and uncomfortable in her bilateral lower abdominal quadrants.  While she was hospitalized she required an enema due to constipation.  She reports no fever, no significant abdominal pain, and no chest pain, lightheadedness, or shortness of breath.  On physical exam she was neurologically intact, abdomen non-peritonitic and nondistended, no rebound tenderness or guarding.  She was in no acute distress and vital signs are stable.   Differential diagnosis includes, but is not limited to: Acute Pancreatitis and Associated Gastrointestinal Inflammation, Constipation and Gastrointestinal Dysmotility, Gastroesophageal Reflux Disease (GERD) or Reflux Symptoms Hypokalemia:, small bowel obstruction     ED Course  as of 04/28/24 1517  Thu Apr 28, 2024  1134 CBC Un actionable.   1143 Lipase(!): 207  1143 SGOT (AST)(!): 78  1144 ALT(!): 90  1223 Glucose, UA(!): >1000 mg/dL Patient is on jardiance.   1438 CT IMPRESSION: 1.  Findings of likely small bowel obstruction, with transition point in the distal ileum. 2.  Focal soft tissue thickening and edema adjacent to the pancreatic head, likely sequela of recent reported episode of pancreatitis. 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 No peripancreatic collections or evidence of necrotizing pancreatitis.   [id number] Spoke with surgery, they will come and evaluate the patient.   1516 Patient remains hemodynamically stable. She was handed off to oncoming team with disposition pending surgery recommendations.     Additional MDM Elements     Discussion with other professionals: Consultant -   Independent interpretation: CT scan(s) -   I have reviewed recent and relavant previous record, including: Inpatient notes -   Outpatient notes -     Portions of this record have been created using Dragon dictation software. Dictation errors have been sought, but may not have been identified and corrected.  See chart and nursing documentation for additional ED course details.  ____________________________________________      History     Reason for Visit Emesis  History of Present Illness Haley Brennan is a 57 year old female with pancreatitis and diverticulosis who presents with recurrent vomiting and abdominal discomfort.  She experiences recurrent episodes of vomiting and abdominal discomfort, with nausea and 'bile-like' vomiting of minimal content this morning. She was discharged from the hospital on Tuesday after treatment for pancreatitis, with no vomiting  since Sunday until this morning. She was given antibiotics as a precaution, but was not told specifically that they were for diverticulitis.  She describes a  sensation of fullness in her stomach, with discomfort that sometimes feels like a burp that does not relieve the pressure. She has been unable to lay flat due to discomfort and has been sleeping on a wedge or the couch to alleviate symptoms. Vomiting episodes are preceded by a sensation of fullness and discomfort in the upper abdomen.  She has a history of diverticulosis in both the small and large intestines and was treated with antibiotics upon discharge from the hospital. She was also treated for pancreatitis, with inflammation noted in the liver and small intestines. Potassium supplements were administered during her hospital stay due to low potassium levels.  Since discharge, she has experienced minimal bowel movements, described as small, green, and infrequent. Bowel movements improved after an enema was administered in the hospital. She has been on a bland diet, consuming oatmeal and low-fat foods, but remains cautious about eating due to fear of exacerbating her symptoms.  No fever, chest pain, shortness of breath, or significant abdominal pain, but there is discomfort and occasional shooting pains in her legs. She has been taking her blood pressure medication and citalopram, but has not been able to keep much else down since this morning.    Past Medical History[1]  Past Surgical History[2]  No current facility-administered medications for this encounter. No current outpatient medications on file.  Allergies Patient has no known allergies.  Family History[3]  Short Social History[4]    Physical Exam   ED Triage Vitals [04/28/24 0930]  Enc Vitals Group     BP 157/99     Pulse 86     SpO2 Pulse      Resp 18     Temp 36.8 C (98.2 F)     Temp Source Oral     SpO2 99 %     Weight (!) 115.1 kg (253 lb 11.2 oz)     Height 1.727 m (5' 8)     Head Circumference      Peak Flow      Pain Score      Pain Loc      Pain Education      Exclude from Growth Chart      Constitutional: Alert and oriented. Well appearing and in no distress. Eyes: Conjunctivae are normal. Hematological/Lymphatic/Immunilogical: No cervical lymphadenopathy. Cardiovascular: Normal rate, regular rhythm. Normal and symmetric distal pulses are present in all extremities. Respiratory: Normal respiratory effort. Breath sounds are normal. Gastrointestinal: Soft and non-distended. There is no CVA tenderness. No rebound tenderness or guarding. Abdomen non-peritonitic. Musculoskeletal: Normal range of motion in all extremities.      Right lower leg: No tenderness or edema.      Left lower leg: No tenderness or edema. Neurologic: Normal speech and language. No gross focal neurologic deficits are appreciated. Skin: Skin is warm, dry and intact. No rash noted. Psychiatric: Mood and affect are normal. Speech and behavior are normal.    Radiology   CT Abdomen Pelvis W IV Contrast  Final Result  1.  Findings of likely small bowel obstruction, with transition point in the distal ileum.  2.  Focal soft tissue thickening and edema adjacent to the pancreatic head, likely sequela of recent reported episode of pancreatitis. 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 No peripancreatic collections or evidence of necrotizing pancreatitis.    ++++++++++++++++++++  The findings of this study were discussed via telephone with Dr. KATHERINE C WEAVER by Dr. Leonce Comment on 04/28/2024 1:47 PM.    -----------------------------------------------       Procedures including Critical Care        [1] No past medical history on file. [2] No past surgical history on file. [3] No family history on file. [4] Social History Tobacco Use  . Smoking status: Never  . Smokeless tobacco: Never  Vaping Use  . Vaping status: Never Used  Substance Use Topics  . Alcohol use: Not Currently  . Drug use: Not Currently   Lelon Comer BROCKS,  MD Resident 04/28/24 [phone]

## 2024-04-29 NOTE — Progress Notes (Signed)
 SURGERY PROGRESS NOTE  Service Date: 04/29/2024 Admit Date: 04/28/2024, Hospital Day: 2 Hospital Service: General Surgery Attending: Vicenta Glendia Gee, MD  Assessment   Haley Brennan is a 58 y.o. female with h/o hypertension, hyperlipidemia and recent hospitalization for pancreatitis (discharged 04/26/2024) who presented to the ED on 04/28/2024 due to nausea and abdominal bloating and was found to have a small bowel obstruction on CT.  She has been managed conservatively.  Plan  Neuro: As needed Tylenol , Dilaudid, oxycodone   CV: HDS   Pulm: stable on room air  FEN/GI: NPO. Continue LR at 150 mL/hr.  *Nausea: As needed Zofran  *Electrolytes: replete as needed  - Gastrografin challenge today  GU: Adequate UOP. Cr 0.94.   -Voiding spontaneously  Heme/ID: Afebrile. No leukocytosis..  - Lovenox for DVT prophylaxis  Endo: Euglycemic  Disposition: Floor status.   ----- Haley Nestor Rhein, MD General Surgery PGY2    Subjective  No acute events overnight.  Hemodynamically stable and afebrile.  Pain was better this morning since NG tube placement.  Objective   Vitals:  Temp:  [35.9 C (96.6 F)-37.6 C (99.7 F)] 37.6 C (99.7 F) Pulse:  [64-80] 74 Resp:  [16-18] 18 BP: (153-172)/(83-89) 172/89 MAP (mmHg):  [104-114] 114 SpO2:  [95 %-100 %] 100 %  Intake/Output last 24 hours: I/O last 3 completed shifts: In: 1650 [P.O.:240; I.V.:410; IV Piggyback:1000] Out: 1400 [Emesis/NG output:1400]  Physical Exam: -General:  Appropriate, comfortable and in no apparent distress.  -Neurological: Moves all 4 extremities spontaneously.  -Cardiovascular: Regular rate and rhythm. -Pulmonary: Normal work of breathing.  -Abdomen: Soft, mildly and diffusely tender, moderately-distended. No rebound or guarding.  -Extremities: Warm, well perfused.   Labs: All lab results last 24 hours:   Recent Results (from the past 24 hours)  CBC   Collection Time: 04/29/24  6:13 AM   Result Value Ref Range   WBC 8.2 3.6 - 11.2 10*9/L   RBC 4.26 3.95 - 5.13 10*12/L   HGB 12.6 11.3 - 14.9 g/dL   HCT 62.7 65.9 - 55.9 %   MCV 87.3 77.6 - 95.7 fL   MCH 29.7 25.9 - 32.4 pg   MCHC 34.0 32.0 - 36.0 g/dL   RDW 85.3 87.7 - 84.7 %   MPV 8.4 6.8 - 10.7 fL   Platelet 271 150 - 450 10*9/L  Comprehensive metabolic panel   Collection Time: 04/29/24  6:13 AM  Result Value Ref Range   Sodium 145 135 - 145 mmol/L   Potassium 3.7 3.4 - 4.8 mmol/L   Chloride 105 98 - 107 mmol/L   CO2 24.5 20.0 - 31.0 mmol/L   Anion Gap 16 (H) 5 - 14 mmol/L   BUN 9 9 - 23 mg/dL   Creatinine 9.05 9.44 - 1.02 mg/dL   BUN/Creatinine Ratio 10    eGFR CKD-EPI (2021) Female 70 >=60 mL/min/1.26m2   Glucose 99 70 - 179 mg/dL   Calcium 9.0 8.7 - 89.5 mg/dL   Albumin 3.0 (L) 3.4 - 5.0 g/dL   Total Protein 6.4 5.7 - 8.2 g/dL   Total Bilirubin 0.4 0.3 - 1.2 mg/dL   AST 55 (H) <=65 U/L   ALT 80 (H) 10 - 49 U/L   Alkaline Phosphatase 90 46 - 116 U/L  Magnesium Level   Collection Time: 04/29/24  6:13 AM  Result Value Ref Range   Magnesium 1.7 1.6 - 2.6 mg/dL  Phosphorus Level   Collection Time: 04/29/24  6:13 AM  Result Value Ref Range  Phosphorus 3.4 2.4 - 5.1 mg/dL    Imaging: XR Abdomen 1 View Result Date: 04/29/2024 EXAM: XR ABDOMEN 1 VIEW ACCESSION: [id number] UN REPORT DATE: 04/29/2024 2:02 PM CLINICAL INDICATION: 58 years old with Gastrografin challenge ; OTHER, 4 hours post administration COMPARISON: Earlier same day abdominal radiograph TECHNIQUE: Supine view of the abdomen, 2 image(s) FINDINGS: Similar positioning of the nonweighted esophagogastric tube overlying the stomach. The stomach is distended with contrast. Contrast is seen throughout numerous dilated loops of small bowel measuring up to 4.7 cm. No contrast appreciated within the colon at this time.   Contrast seen within numerous dilated loops of small bowel. No contrast appreciated within the colon at this time.   XR Abdomen 1  View Result Date: 04/29/2024 EXAM: XR ABDOMEN 1 VIEW ACCESSION: [id number] UN REPORT DATE: 04/29/2024 10:44 AM CLINICAL INDICATION: 58 years old with Gastrografin challenge ; OTHER  COMPARISON: Abdominal radiograph and CT abdomen pelvis dated 04/28/2024 TECHNIQUE: Supine view of the abdomen, 2 image(s) FINDINGS: Nonweighted esophagogastric tube courses below the diaphragm and is coiled overlying the distal stomach with tip directed back towards the gastric body. Scattered air within the colon. Paucity of small bowel gas limits evaluation. Degenerative changes of the spine. Lung bases are clear.   Esophagogastric tube is coiled overlying the distal stomach with tip directed back towards the gastric body. Recommend retraction and repositioning. Paucity of small bowel gas limits evaluation.   XR Abdomen Portable Result Date: 04/28/2024 EXAM: XR ABDOMEN PORTABLE ACCESSION: [id number] UN REPORT DATE: 04/28/2024 4:52 PM CLINICAL INDICATION: 58 years old with eval NGT ; OTHER  COMPARISON: CT abdomen pelvis 04/28/2024 TECHNIQUE:Upright view of the abdomen, 1 image(s) FINDINGS: Enteric tube with tip and sideport overlying the expected location of the stomach. The visualized lung fields are clear. Bowel gas pattern is better evaluated on same-day CT abdomen pelvis.   Enteric tube with tip and sideport overlying the expected location of stomach.   CT Abdomen Pelvis W IV Contrast Result Date: 04/28/2024 EXAM: CT ABDOMEN PELVIS W CONTRAST ACCESSION: [id number] UN REPORT DATE: 04/28/2024 1:17 PM CLINICAL INDICATION: 58 years old with vomiting, recent SBO and diverticulitis. Recently hospitalized on 04/21/2024 for acute pancreatitis. COMPARISON: None TECHNIQUE: A helical CT scan of the abdomen and pelvis was obtained following IV contrast from the lung bases through the pubic symphysis. Images were reconstructed in the axial plane. Coronal and sagittal reformatted images were also provided for further evaluation.  FINDINGS: LOWER CHEST: Oceanographer. Mild bibasilar bronchiectasis. Partially visualized coronary artery calcification. LIVER: Normal liver contour.  No focal liver lesions. BILIARY: The gallbladder is normal in appearance. No biliary ductal dilatation.  SPLEEN: Normal in size and contour. PANCREAS: Normal pancreatic contour.  No focal lesions.  No ductal dilation. Small focus of soft tissue thickening and edema adjacent to the pancreatic head (2:75), which may represent sequela of recent reported pancreatitis. No nonenhancing pancreatic tissue. ADRENAL GLANDS: Normal appearance of the adrenal glands. KIDNEYS/URETERS: Bilateral cortical thinning. Symmetric renal enhancement. Bilateral simple parapelvic cysts. No hydronephrosis. No solid renal mass. BLADDER: Decompressed, limiting evaluation. REPRODUCTIVE ORGANS: Unremarkable. GI TRACT: Small hiatal hernia. Multiple loops of dilated fluid-filled small bowel measuring up to 3.9 cm, with a transition point in the distal ileum (4:33), with decompressed distal small bowel and colon. There does not appear to be a second transition point to suggest a closed-loop. Colonic diverticulosis without evidence of diverticulitis. Normal appendix. PERITONEUM, RETROPERITONEUM AND MESENTERY: No free air.  No ascites. Trace free fluid in the right paracolic gutter and deep pelvis.  No organized fluid collection. LYMPH NODES: No adenopathy. VESSELS: Hepatic and portal veins are patent.  Normal caliber aorta with mild scattered calcified atherosclerosis.  BONES and SOFT TISSUES: Mild degenerative changes in spine. 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 No aggressive osseous lesions.  No focal soft tissue lesions. Small fat-containing umbilical hernia.   1.  Findings of likely small bowel obstruction, with transition point in the distal ileum. 2.  Focal soft tissue thickening and edema adjacent to the pancreatic head, likely sequela of recent reported episode of  pancreatitis. 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 No peripancreatic collections or evidence of necrotizing pancreatitis. ++++++++++++++++++++ The findings of this study were discussed via telephone with Dr. KATHERINE C WEAVER by Dr. Leonce Comment on 04/28/2024 1:47 PM. -----------------------------------------------  Radiology studies were personally reviewed  Cultures:  Microbiology Results (last day)     ** No results found for the last 24 hours. **       Scheduled Meds:Scheduled Medications[1]  Continuous Infusions:Infusions Meds[2]  PRN Meds:PRN Medications[3]    I saw and evaluated the patient and discussed the assessment and plan with the resident.  I provided the required supervision for all services billed.  I independently reviewed the image(s) and agree with the interpretation and plan as documented in the resident's note. VICENTA GORMAN GEE, MD       [1] . diatrizoate meglumine-sodium  120 mL Oral Once  . enoxaparin (LOVENOX) injection  40 mg Subcutaneous Q24H  . pantoprazole (Protonix) intravenous solution  40 mg Intravenous Daily  [2] . lactated Ringers 150 mL/hr (04/28/24 2335)  [3] acetaminophen , HYDROmorphone, labetalol, naloxone, ondansetron  **OR** ondansetron , oxyCODONE , phenol

## 2024-06-02 ENCOUNTER — Other Ambulatory Visit: Payer: Self-pay | Admitting: Gastroenterology

## 2024-06-02 DIAGNOSIS — Z8719 Personal history of other diseases of the digestive system: Secondary | ICD-10-CM

## 2024-06-10 ENCOUNTER — Ambulatory Visit
Admission: RE | Admit: 2024-06-10 | Discharge: 2024-06-10 | Disposition: A | Source: Ambulatory Visit | Attending: Gastroenterology

## 2024-06-10 ENCOUNTER — Ambulatory Visit
Admission: RE | Admit: 2024-06-10 | Discharge: 2024-06-10 | Disposition: A | Discharge status: Home / Self Care | Source: Ambulatory Visit | Attending: Gastroenterology | Admitting: Gastroenterology

## 2024-06-10 DIAGNOSIS — Z8719 Personal history of other diseases of the digestive system: Secondary | ICD-10-CM

## 2024-06-10 MED ORDER — GADOBUTROL 1 MMOL/ML IV SOLN
10.0000 mL | Freq: Once | INTRAVENOUS | Status: AC | PRN
  Administered 2024-06-10: 10 mL via INTRAVENOUS

## 2024-06-10 MED ORDER — BARIUM SULFATE 0.1 % PO SUSP: 450.0000 mL | Freq: Once | ORAL | Status: DC

## 2024-07-05 ENCOUNTER — Other Ambulatory Visit: Payer: Self-pay | Admitting: Internal Medicine

## 2024-07-05 DIAGNOSIS — Z1231 Encounter for screening mammogram for malignant neoplasm of breast: Secondary | ICD-10-CM

## 2024-07-13 ENCOUNTER — Ambulatory Visit
Admission: RE | Admit: 2024-07-13 | Discharge: 2024-07-13 | Disposition: A | Discharge status: Home / Self Care | Source: Home / Self Care | Attending: Gastroenterology | Admitting: Gastroenterology

## 2024-07-13 ENCOUNTER — Ambulatory Visit: Payer: Self-pay

## 2024-07-13 ENCOUNTER — Encounter: Payer: Self-pay | Admitting: Gastroenterology

## 2024-07-13 ENCOUNTER — Encounter
Admission: RE | Disposition: A | Discharge status: Home / Self Care | Payer: Self-pay | Source: Home / Self Care | Attending: Gastroenterology

## 2024-07-13 ENCOUNTER — Other Ambulatory Visit: Payer: Self-pay

## 2024-07-13 DIAGNOSIS — Z8719 Personal history of other diseases of the digestive system: Secondary | ICD-10-CM | POA: Insufficient documentation

## 2024-07-13 DIAGNOSIS — I10 Essential (primary) hypertension: Secondary | ICD-10-CM | POA: Insufficient documentation

## 2024-07-13 DIAGNOSIS — K219 Gastro-esophageal reflux disease without esophagitis: Secondary | ICD-10-CM | POA: Insufficient documentation

## 2024-07-13 DIAGNOSIS — K5730 Diverticulosis of large intestine without perforation or abscess without bleeding: Secondary | ICD-10-CM | POA: Diagnosis not present

## 2024-07-13 DIAGNOSIS — D122 Benign neoplasm of ascending colon: Secondary | ICD-10-CM | POA: Insufficient documentation

## 2024-07-13 DIAGNOSIS — F32A Depression, unspecified: Secondary | ICD-10-CM | POA: Diagnosis not present

## 2024-07-13 DIAGNOSIS — Z09 Encounter for follow-up examination after completed treatment for conditions other than malignant neoplasm: Secondary | ICD-10-CM | POA: Diagnosis not present

## 2024-07-13 DIAGNOSIS — I6529 Occlusion and stenosis of unspecified carotid artery: Secondary | ICD-10-CM | POA: Insufficient documentation

## 2024-07-13 HISTORY — PX: POLYPECTOMY: SHX149

## 2024-07-13 HISTORY — PX: ESOPHAGOGASTRODUODENOSCOPY: SHX5428

## 2024-07-13 HISTORY — PX: COLONOSCOPY: SHX5424

## 2024-07-13 SURGERY — COLONOSCOPY
Anesthesia: General

## 2024-07-13 MED ORDER — ONDANSETRON HCL 4 MG/2ML IJ SOLN
INTRAMUSCULAR | Status: DC | PRN
  Administered 2024-07-13: 4 mg via INTRAVENOUS

## 2024-07-13 MED ORDER — GLYCOPYRROLATE 0.2 MG/ML IJ SOLN
INTRAMUSCULAR | Status: AC
  Filled 2024-07-13: qty 1

## 2024-07-13 MED ORDER — GLYCOPYRROLATE 0.2 MG/ML IJ SOLN
INTRAMUSCULAR | Status: DC | PRN
  Administered 2024-07-13: .2 mg via INTRAVENOUS

## 2024-07-13 MED ORDER — LIDOCAINE HCL (PF) 2 % IJ SOLN
INTRAMUSCULAR | Status: DC | PRN
  Administered 2024-07-13: 60 mg via INTRADERMAL

## 2024-07-13 MED ORDER — PROPOFOL 500 MG/50ML IV EMUL
INTRAVENOUS | Status: DC | PRN
  Administered 2024-07-13 (×2): 30 mg via INTRAVENOUS
  Administered 2024-07-13: 125 ug/kg/min via INTRAVENOUS

## 2024-07-13 MED ORDER — FENTANYL CITRATE (PF) 100 MCG/2ML IJ SOLN
INTRAMUSCULAR | Status: DC | PRN
  Administered 2024-07-13: 100 ug via INTRAVENOUS

## 2024-07-13 MED ORDER — FENTANYL CITRATE (PF) 100 MCG/2ML IJ SOLN
INTRAMUSCULAR | Status: AC
  Filled 2024-07-13: qty 2

## 2024-07-13 MED ORDER — PROPOFOL 1000 MG/100ML IV EMUL
INTRAVENOUS | Status: AC
  Filled 2024-07-13: qty 100

## 2024-07-13 MED ORDER — SODIUM CHLORIDE 0.9 % IV SOLN: INTRAVENOUS | Status: DC

## 2024-07-13 MED ORDER — SODIUM CHLORIDE 0.9 % IV SOLN: INTRAVENOUS | Status: DC | PRN

## 2024-07-13 MED ORDER — ONDANSETRON HCL 4 MG/2ML IJ SOLN
INTRAMUSCULAR | Status: AC
  Filled 2024-07-13: qty 2

## 2024-07-13 NOTE — Op Note (Signed)
 [facility] Gastroenterology Patient Name: Haley Brennan Procedure Date: 07/13/2024 8:18 AM MRN: [id number] Account #: [id number] Date of Birth: Dec 27, 1965 Admit Type: Outpatient Age: 58 Room: [facility] ENDO ROOM 3 Gender: Female Note Status: Finalized Instrument Name: Colon Scope [id number] Procedure:             Colonoscopy Indications:           Follow-up of colonic obstruction Providers:             Ruel Kung MD, MD Referring MD:          Layman ORN. Lenon MD, MD (Referring MD) Medicines:             Monitored Anesthesia Care Complications:         No immediate complications. Procedure:             Pre-Anesthesia Assessment:                        - Prior to the procedure, a History and Physical was                         performed, and patient medications, allergies and                         sensitivities were reviewed. The patient's tolerance                         of previous anesthesia was reviewed.                        - The risks and benefits of the procedure and the                         sedation options and risks were discussed with the                         patient. All questions were answered and informed                         consent was obtained.                        - ASA Grade Assessment: II - A patient with mild                         systemic disease.                        After obtaining informed consent, the colonoscope was                         passed under direct vision. Throughout the procedure,                         the patient's blood pressure, pulse, and oxygen                         saturations were monitored continuously. The                         Colonoscope  was introduced through the anus and                         advanced to the the terminal ileum. The colonoscopy                         was performed with ease. The patient tolerated the                         procedure well. The quality of the bowel  preparation                         was adequate. The terminal ileum, ileocecal valve,                         appendiceal orifice, and rectum were photographed. Findings:      Multiple medium-mouthed diverticula were found in the left colon.      The terminal ileum appeared normal.      A 16 mm polyp was found in the proximal ascending colon. The polyp was       Paris classification IIa (superficial, elevated). was a linear elongated       polyp over a fold ina diffuclt location just proximal to the junction of       the cecum and ascending colon The polyp was removed with a piecemeal       technique using a cold snare. Resection was complete, and retrieval was       complete.      The exam was otherwise without abnormality on direct and retroflexion       views. Impression:            - Diverticulosis in the left colon.                        - The examined portion of the ileum was normal.                        - One 16 mm polyp in the proximal ascending colon,                         removed piecemeal using a cold snare. Resected and                         retrieved.                        - The examination was otherwise normal on direct and                         retroflexion views. Recommendation:        - Discharge patient to home (with escort).                        - Resume previous diet.                        - Continue present medications.                        - Await pathology results.                        -  Repeat colonoscopy in 6 months for surveillance and                         for surveillance after piecemeal polypectomy. Procedure Code(s):     --- Professional ---                        [phone], Colonoscopy, flexible; with removal of                         tumor(s), polyp(s), or other lesion(s) by snare                         technique Diagnosis Code(s):     --- Professional ---                        D12.2, Benign neoplasm of ascending colon                         K56.609, Unspecified intestinal obstruction,                         unspecified as to partial versus complete obstruction                        K57.30, Diverticulosis of large intestine without                         perforation or abscess without bleeding CPT copyright 2022 American Medical Association. All rights reserved. The codes documented in this report are preliminary and upon coder review may  be revised to meet current compliance requirements. Ruel Kung, MD Ruel Kung MD, MD 07/13/2024 8:56:41 AM This report has been signed electronically. Number of Addenda: 0 Note Initiated On: 07/13/2024 8:18 AM Scope Withdrawal Time: 0 hours 21 minutes 4 seconds  Total Procedure Duration: 0 hours 22 minutes 45 seconds  Estimated Blood Loss:  Estimated blood loss: none.      [facility]

## 2024-07-13 NOTE — Anesthesia Preprocedure Evaluation (Addendum)
"                                    Anesthesia Evaluation  Patient identified by MRN, date of birth, ID band Patient awake    Reviewed: Allergy & Precautions, H&P , NPO status , Patient's Chart, lab work & pertinent test results  Airway Mallampati: II  TM Distance: >3 FB Neck ROM: full    Dental no notable dental hx.    Pulmonary    Pulmonary exam normal        Cardiovascular Exercise Tolerance: Good hypertension, + Peripheral Vascular Disease (Carotid stenosis)  Normal cardiovascular exam     Neuro/Psych  PSYCHIATRIC DISORDERS  Depression    negative neurological ROS     GI/Hepatic negative GI ROS, Neg liver ROS,,,  Endo/Other    Renal/GU negative Renal ROS  negative genitourinary   Musculoskeletal   Abdominal  (+) + obese  Peds  Hematology negative hematology ROS (+)   Anesthesia Other Findings Past Medical History: No date: Hyperlipemia No date: Hypertension No date: Joint pain 1997: Melanoma (HCC)  Past Surgical History: No date: Achiles tendon No date: bone spur; Right     Comment:  heel 2013: DILATION AND CURETTAGE OF UTERUS     Comment:  with ablation 1997: SKIN CANCER EXCISION; Left     Comment:  melonoma /upper thigh x2 ?: SKIN LESION EXCISION; Left     Comment:  elbow/pre cancerous per pt     Reproductive/Obstetrics negative OB ROS                              Anesthesia Physical Anesthesia Plan  ASA: 2  Anesthesia Plan: General   Post-op Pain Management:    Induction: Intravenous  PONV Risk Score and Plan: Propofol infusion and TIVA  Airway Management Planned: Natural Airway  Additional Equipment:   Intra-op Plan:   Post-operative Plan:   Informed Consent: I have reviewed the patients History and Physical, chart, labs and discussed the procedure including the risks, benefits and alternatives for the proposed anesthesia with the patient or authorized representative who has indicated  his/her understanding and acceptance.     Dental Advisory Given  Plan Discussed with: CRNA and Surgeon  Anesthesia Plan Comments:          Anesthesia Quick Evaluation  "

## 2024-07-13 NOTE — Transfer of Care (Signed)
 Immediate Anesthesia Transfer of Care Note  Patient: Haley Brennan  Procedure(s) Performed: COLONOSCOPY EGD (ESOPHAGOGASTRODUODENOSCOPY) POLYPECTOMY, INTESTINE  Patient Location: PACU  Anesthesia Type:General  Level of Consciousness: awake, alert , and oriented  Airway & Oxygen Therapy: Patient Spontanous Breathing  Post-op Assessment: Report given to RN  Post vital signs: Reviewed and stable  Last Vitals:  Vitals Value Taken Time  BP 112/61 07/13/24 08:58  Temp 35.7 C 07/13/24 08:55  Pulse 59 07/13/24 08:58  Resp 20 07/13/24 08:58  SpO2 98 % 07/13/24 08:58  Vitals shown include unfiled device data.  Last Pain:  Vitals:   07/13/24 0855  TempSrc: Temporal  PainSc: 0-No pain         Complications: There were no known notable events for this encounter.

## 2024-07-13 NOTE — Op Note (Signed)
 [facility] Gastroenterology Patient Name: Haley Brennan Procedure Date: 07/13/2024 8:18 AM MRN: [id number] Account #: [id number] Date of Birth: Nov 18, 1965 Admit Type: Outpatient Age: 58 Room: [facility] ENDO ROOM 3 Gender: Female Note Status: Finalized Instrument Name: Endoscope [id number] Procedure:             Upper GI endoscopy Indications:           Follow-up of esophageal reflux, Personal history of                         diverticulitis Providers:             Ruel Kung MD, MD Referring MD:          Layman ORN. Lenon MD, MD (Referring MD) Medicines:             Monitored Anesthesia Care Complications:         No immediate complications. Procedure:             Pre-Anesthesia Assessment:                        - Prior to the procedure, a History and Physical was                         performed, and patient medications, allergies and                         sensitivities were reviewed. The patient's tolerance                         of previous anesthesia was reviewed.                        - The risks and benefits of the procedure and the                         sedation options and risks were discussed with the                         patient. All questions were answered and informed                         consent was obtained.                        - ASA Grade Assessment: II - A patient with mild                         systemic disease.                        After obtaining informed consent, the endoscope was                         passed under direct vision. Throughout the procedure,                         the patient's blood pressure, pulse, and oxygen                         saturations  were monitored continuously. The Endoscope                         was introduced through the mouth, and advanced to the                         third part of duodenum. The upper GI endoscopy was                         accomplished with ease. The patient  tolerated the                         procedure well. Findings:      The esophagus was normal.      The stomach was normal.      The examined duodenum was normal. Impression:            - Normal esophagus.                        - Normal stomach.                        - Normal examined duodenum.                        - No specimens collected. Recommendation:        - Perform a colonoscopy today. Procedure Code(s):     --- Professional ---                        [phone], Esophagogastroduodenoscopy, flexible,                         transoral; diagnostic, including collection of                         specimen(s) by brushing or washing, when performed                         (separate procedure) Diagnosis Code(s):     --- Professional ---                        K21.9, Gastro-esophageal reflux disease without                         esophagitis                        Z87.19, Personal history of other diseases of the                         digestive system CPT copyright 2022 American Medical Association. All rights reserved. The codes documented in this report are preliminary and upon coder review may  be revised to meet current compliance requirements. Ruel Kung, MD Ruel Kung MD, MD 07/13/2024 8:29:21 AM This report has been signed electronically. Number of Addenda: 0 Note Initiated On: 07/13/2024 8:18 AM Estimated Blood Loss:  Estimated blood loss: none.      [facility]

## 2024-07-13 NOTE — H&P (Signed)
 "  Ruel Kung , MD [address], [address], [place], [place], [address] Phone: [phone] Fax: [phone]  Primary Care Physician:  Lenon Layman ORN, MD   Pre-Procedure History & Physical: HPI:  Haley Brennan is a 58 y.o. female is here for an endoscopy and colonoscopy    Past Medical History:  Diagnosis Date   Hyperlipemia    Hypertension    Joint pain    Melanoma (HCC) 1997    Past Surgical History:  Procedure Laterality Date   Achiles tendon     bone spur Right    heel   DILATION AND CURETTAGE OF UTERUS  2013   with ablation   SKIN CANCER EXCISION Left 1997   melonoma /upper thigh x2   SKIN LESION EXCISION Left ?   elbow/pre cancerous per pt    Prior to Admission medications  Medication Sig Start Date End Date Taking? Authorizing Provider  aspirin  81 MG chewable tablet Chew 81 mg by mouth daily.   Yes [provider]  Cholecalciferol (VITAMIN D3) 2000 UNITS capsule Take 2,000 Units by mouth daily.   Yes [provider]  citalopram  (CELEXA ) 20 MG tablet Take 10 mg by mouth daily.   Yes [provider]  JARDIANCE 10 MG TABS tablet Take 10 mg by mouth daily.   Yes [provider]  metoprolol  (TOPROL -XL) 200 MG 24 hr tablet Take 200 mg by mouth daily.  12/28/13  Yes [provider]  Pyridoxine HCl (VITAMIN B-6 PO) Take by mouth daily.   Yes [provider]  rosuvastatin (CRESTOR) 40 MG tablet Take 40 mg by mouth daily.   Yes [provider]  topiramate  (TOPAMAX ) 25 MG tablet Take 1 tablet by mouth daily. 08/20/21  Yes [provider]  traZODone (DESYREL) 50 MG tablet Take 50 mg by mouth at bedtime. 11/25/18  Yes [provider]  potassium chloride  (KLOR-CON ) 10 MEQ tablet Take 4 tablets (40 mEq total) by mouth daily for 5 days. 04/27/24 05/02/24  Awanda City, MD    Allergies as of 06/02/2024   (No Known Allergies)    Family History  Problem Relation Age of Onset   Seizures  Father    Cancer Father        Brain   Breast cancer Maternal Aunt 32    Social History   Socioeconomic History   Marital status: Married    Spouse name: Not on file   Number of children: Not on file   Years of education: Not on file   Highest education level: Not on file  Occupational History   Not on file  Tobacco Use   Smoking status: Never   Smokeless tobacco: Never  Substance and Sexual Activity   Alcohol use: Yes    Alcohol/week: 0.0 standard drinks of alcohol    Comment: occasionally   Drug use: No   Sexual activity: Yes  Other Topics Concern   Not on file  Social History Narrative   ** Merged History Encounter **       Social Drivers of Health   Tobacco Use: Low Risk (07/13/2024)   Patient History    Smoking Tobacco Use: Never    Smokeless Tobacco Use: Never    Passive Exposure: Not on file  Financial Resource Strain: Low Risk  (06/01/2024)   Received from [facility]   Overall Financial Resource Strain (CARDIA)    Difficulty of Paying Living Expenses: Not hard at all  Food Insecurity:  No Food Insecurity (06/01/2024)   Received from [facility]   Epic    Within the past 12 months, you worried that your food would run out before you got the money to buy more.: Never true    Within the past 12 months, the food you bought just didn't last and you didn't have money to get more.: Never true  Transportation Needs: No Transportation Needs (06/01/2024)   Received from [facility]    In the past 12 months, has lack of transportation kept you from medical appointments or from getting medications?: No    Lack of Transportation (Non-Medical): No  Physical Activity: Not on file  Stress: Not on file  Social Connections: Not on file  Intimate Partner Violence: Not At Risk (04/21/2024)   Epic    Fear of Current or Ex-Partner: No    Emotionally Abused: No    Physically Abused: No     Sexually Abused: No  Depression (PHQ2-9): Not on file  Alcohol Screen: Not on file  Housing: High Risk (06/01/2024)   Received from [facility]   Epic    In the last 12 months, was there a time when you were not able to pay the mortgage or rent on time?: No    In the past 12 months, how many times have you moved where you were living?: 0    At any time in the past 12 months, were you homeless or living in a shelter (including now)?: Yes  Utilities: Not At Risk (06/01/2024)   Received from [facility]   Epic    In the past 12 months has the electric, gas, oil, or water company threatened to shut off services in your home?: No  Health Literacy: Not on file    Review of Systems: See HPI, otherwise negative ROS  Physical Exam: BP (!) 146/95   Pulse (!) 58   Temp (!) 96.4 F (35.8 C) (Temporal)   Resp 16   Ht 5' 8 (1.727 m)   Wt 109.3 kg   SpO2 100%   BMI 36.64 kg/m  General:   Alert,  pleasant and cooperative in NAD Head:  Normocephalic and atraumatic. Neck:  Supple; no masses or thyromegaly. Lungs:  Clear throughout to auscultation, normal respiratory effort.    Heart:  +S1, +S2, Regular rate and rhythm, No edema. Abdomen:  Soft, nontender and nondistended. Normal bowel sounds, without guarding, and without rebound.   Neurologic:  Alert and  oriented x4;  grossly normal neurologically.  Impression/Plan: Haley Brennan is here for an endoscopy and colonoscopy  to be performed for  evaluation of duodenal diverticulitis and small bowel obstruction     Risks, benefits, limitations, and alternatives regarding endoscopy have been reviewed with the patient.  Questions have been answered.  All parties agreeable.   Ruel Kung, MD  07/13/2024, 8:13 AM  "

## 2024-07-13 NOTE — Anesthesia Postprocedure Evaluation (Signed)
"   Anesthesia Post Note  Patient: Haley Brennan  Procedure(s) Performed: COLONOSCOPY EGD (ESOPHAGOGASTRODUODENOSCOPY) POLYPECTOMY, INTESTINE  Patient location during evaluation: Endoscopy Anesthesia Type: General Level of consciousness: awake and alert Pain management: pain level controlled Vital Signs Assessment: post-procedure vital signs reviewed and stable Respiratory status: spontaneous breathing, nonlabored ventilation and respiratory function stable Cardiovascular status: blood pressure returned to baseline and stable Postop Assessment: no apparent nausea or vomiting Anesthetic complications: no   There were no known notable events for this encounter.   Last Vitals:  Vitals:   07/13/24 0905 07/13/24 0925  BP: 116/78 122/78  Pulse: 63 68  Resp: 20   Temp:    SpO2: 99%     Last Pain:  Vitals:   07/13/24 0855  TempSrc: Temporal  PainSc: 0-No pain                 Camellia Merilee Louder      "

## 2024-07-15 LAB — SURGICAL PATHOLOGY

## 2024-07-20 ENCOUNTER — Ambulatory Visit: Payer: Self-pay | Admitting: Gastroenterology

## 2024-07-29 ENCOUNTER — Ambulatory Visit
Admission: RE | Admit: 2024-07-29 | Discharge: 2024-07-29 | Disposition: A | Discharge status: Home / Self Care | Source: Ambulatory Visit | Attending: Internal Medicine | Admitting: Internal Medicine

## 2024-07-29 DIAGNOSIS — Z1231 Encounter for screening mammogram for malignant neoplasm of breast: Secondary | ICD-10-CM | POA: Insufficient documentation

## 2024-08-10 ENCOUNTER — Other Ambulatory Visit
Admission: RE | Admit: 2024-08-10 | Discharge: 2024-08-10 | Disposition: A | Discharge status: Home / Self Care | Source: Ambulatory Visit | Attending: Internal Medicine | Admitting: Internal Medicine

## 2024-08-10 DIAGNOSIS — R0789 Other chest pain: Secondary | ICD-10-CM | POA: Diagnosis present

## 2024-08-10 LAB — D-DIMER, QUANTITATIVE: D-Dimer, Quant: 1.45 ug{FEU}/mL — ABNORMAL HIGH (ref 0.00–0.50)

## 2024-08-11 ENCOUNTER — Ambulatory Visit
Admission: RE | Admit: 2024-08-11 | Discharge: 2024-08-11 | Disposition: A | Discharge status: Home / Self Care | Source: Ambulatory Visit | Attending: Internal Medicine | Admitting: Internal Medicine

## 2024-08-11 ENCOUNTER — Other Ambulatory Visit: Payer: Self-pay | Admitting: Internal Medicine

## 2024-08-11 DIAGNOSIS — R7989 Other specified abnormal findings of blood chemistry: Secondary | ICD-10-CM | POA: Diagnosis present

## 2024-08-11 DIAGNOSIS — R071 Chest pain on breathing: Secondary | ICD-10-CM | POA: Diagnosis present

## 2024-08-11 MED ORDER — IOHEXOL 350 MG/ML SOLN
75.0000 mL | Freq: Once | INTRAVENOUS | Status: AC | PRN
  Administered 2024-08-11: 75 mL via INTRAVENOUS

## 2024-08-12 LAB — POCT I-STAT CREATININE: Creatinine, Ser: 1.1 mg/dL — ABNORMAL HIGH (ref 0.44–1.00)

## 2024-08-17 ENCOUNTER — Other Ambulatory Visit: Payer: Self-pay | Admitting: Physician Assistant

## 2024-08-17 DIAGNOSIS — E785 Hyperlipidemia, unspecified: Secondary | ICD-10-CM

## 2024-08-17 DIAGNOSIS — I10 Essential (primary) hypertension: Secondary | ICD-10-CM

## 2024-08-17 DIAGNOSIS — R931 Abnormal findings on diagnostic imaging of heart and coronary circulation: Secondary | ICD-10-CM

## 2024-08-17 DIAGNOSIS — E66812 Obesity, class 2: Secondary | ICD-10-CM

## 2024-08-18 ENCOUNTER — Ambulatory Visit: Admission: RE | Admit: 2024-08-18 | Discharge: 2024-08-18 | Attending: Physician Assistant

## 2024-08-18 DIAGNOSIS — E785 Hyperlipidemia, unspecified: Secondary | ICD-10-CM

## 2024-08-18 DIAGNOSIS — E6609 Other obesity due to excess calories: Secondary | ICD-10-CM

## 2024-08-18 DIAGNOSIS — R931 Abnormal findings on diagnostic imaging of heart and coronary circulation: Secondary | ICD-10-CM

## 2024-08-18 DIAGNOSIS — I10 Essential (primary) hypertension: Secondary | ICD-10-CM

## 2024-08-18 MED ORDER — METOPROLOL TARTRATE 5 MG/5ML IV SOLN: 10.0000 mg | Freq: Once | INTRAVENOUS | Status: DC | PRN

## 2024-08-18 MED ORDER — DILTIAZEM HCL 25 MG/5ML IV SOLN: 10.0000 mg | INTRAVENOUS | Status: DC | PRN

## 2024-08-18 MED ORDER — NITROGLYCERIN 0.4 MG SL SUBL
0.8000 mg | SUBLINGUAL_TABLET | Freq: Once | SUBLINGUAL | Status: AC
  Administered 2024-08-18: 0.8 mg via SUBLINGUAL
  Filled 2024-08-18: qty 25

## 2024-08-18 MED ORDER — IOHEXOL 350 MG/ML SOLN
100.0000 mL | Freq: Once | INTRAVENOUS | Status: AC | PRN
  Administered 2024-08-18: 100 mL via INTRAVENOUS

## 2024-08-18 NOTE — Progress Notes (Signed)
 Patient tolerated CT well. Vital signs stable encourage to drink water throughout day.Reasons explained and verbalized understanding. Ambulated steady gait.

## 2024-08-26 ENCOUNTER — Ambulatory Visit (INDEPENDENT_AMBULATORY_CARE_PROVIDER_SITE_OTHER): Payer: 59 | Admitting: Nurse Practitioner

## 2024-08-26 ENCOUNTER — Encounter (INDEPENDENT_AMBULATORY_CARE_PROVIDER_SITE_OTHER): Payer: 59
